# Patient Record
Sex: Female | Born: 2000 | Race: White | Hispanic: No | Marital: Single | Smoking: Never smoker
Health system: Southern US, Community
[De-identification: ages and names within clinical notes are randomized; demographics above are authoritative.]

## PROBLEM LIST (undated history)

## (undated) DIAGNOSIS — J45909 Unspecified asthma, uncomplicated: Secondary | ICD-10-CM

## (undated) DIAGNOSIS — F909 Attention-deficit hyperactivity disorder, unspecified type: Secondary | ICD-10-CM

## (undated) DIAGNOSIS — S060XAA Concussion with loss of consciousness status unknown, initial encounter: Secondary | ICD-10-CM

## (undated) DIAGNOSIS — M545 Low back pain, unspecified: Secondary | ICD-10-CM

## (undated) HISTORY — PX: WISDOM TOOTH EXTRACTION: SHX21

## (undated) HISTORY — DX: Unspecified asthma, uncomplicated: J45.909

## (undated) HISTORY — DX: Low back pain, unspecified: M54.50

---
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
## 2018-05-13 ENCOUNTER — Emergency Department
Admission: EM | Admit: 2018-05-13 | Discharge: 2018-05-13 | Disposition: A | Discharge status: Home / Self Care | Payer: No Typology Code available for payment source | Attending: Emergency Medicine | Admitting: Emergency Medicine

## 2018-05-13 ENCOUNTER — Emergency Department: Payer: No Typology Code available for payment source

## 2018-05-13 DIAGNOSIS — R1031 Right lower quadrant pain: Secondary | ICD-10-CM | POA: Insufficient documentation

## 2018-05-13 LAB — URINALYSIS REFLEX TO MICROSCOPIC EXAM - REFLEX TO CULTURE
Bilirubin, UA: NEGATIVE
Blood, UA: NEGATIVE
Glucose, UA: NEGATIVE
Ketones UA: NEGATIVE
Leukocyte Esterase, UA: NEGATIVE
Nitrite, UA: NEGATIVE
Protein, UR: NEGATIVE
Specific Gravity UA: 1.01 (ref 1.001–1.035)
Urine pH: 6 (ref 5.0–8.0)
Urobilinogen, UA: NORMAL mg/dL (ref 0.2–2.0)

## 2018-05-13 LAB — COMPREHENSIVE METABOLIC PANEL
ALT: 22 U/L (ref 0–55)
AST (SGOT): 16 U/L (ref 5–34)
Albumin/Globulin Ratio: 1.3 (ref 0.9–2.2)
Albumin: 4.1 g/dL (ref 3.5–5.0)
Alkaline Phosphatase: 65 U/L (ref 37–106)
Anion Gap: 11 (ref 5.0–15.0)
BUN: 10 mg/dL (ref 7.0–19.0)
Bilirubin, Total: 0.3 mg/dL (ref 0.2–1.2)
CO2: 22 mEq/L (ref 22–29)
Calcium: 9.6 mg/dL (ref 8.5–10.5)
Chloride: 107 mEq/L (ref 100–111)
Creatinine: 0.8 mg/dL (ref 0.6–1.0)
Globulin: 3.1 g/dL (ref 2.0–3.6)
Glucose: 95 mg/dL (ref 70–100)
Potassium: 3.6 mEq/L (ref 3.5–5.1)
Protein, Total: 7.2 g/dL (ref 6.0–8.3)
Sodium: 140 mEq/L (ref 136–145)

## 2018-05-13 LAB — CBC AND DIFFERENTIAL
Absolute NRBC: 0 10*3/uL (ref 0.00–0.00)
Basophils Absolute Automated: 0.04 10*3/uL (ref 0.00–0.08)
Basophils Automated: 0.4 %
Eosinophils Absolute Automated: 0.58 10*3/uL — ABNORMAL HIGH (ref 0.00–0.44)
Eosinophils Automated: 5.6 %
Hematocrit: 36.5 % (ref 34.7–43.7)
Hgb: 12.8 g/dL (ref 11.4–14.8)
Immature Granulocytes Absolute: 0.03 10*3/uL (ref 0.00–0.07)
Immature Granulocytes: 0.3 %
Lymphocytes Absolute Automated: 3.12 10*3/uL (ref 0.42–3.22)
Lymphocytes Automated: 30.2 %
MCH: 29.5 pg (ref 25.1–33.5)
MCHC: 35.1 g/dL (ref 31.5–35.8)
MCV: 84.1 fL (ref 78.0–96.0)
MPV: 10.6 fL (ref 8.9–12.5)
Monocytes Absolute Automated: 0.5 10*3/uL (ref 0.21–0.85)
Monocytes: 4.8 %
Neutrophils Absolute: 6.05 10*3/uL (ref 1.10–6.33)
Neutrophils: 58.7 %
Nucleated RBC: 0 /100 WBC (ref 0.0–0.0)
Platelets: 334 10*3/uL (ref 142–346)
RBC: 4.34 10*6/uL (ref 3.90–5.10)
RDW: 12 % (ref 11–15)
WBC: 10.32 10*3/uL — ABNORMAL HIGH (ref 3.10–9.50)

## 2018-05-13 LAB — HCG, SERUM, QUALITATIVE: Hcg Qualitative: NEGATIVE

## 2018-05-13 LAB — LIPASE: Lipase: 25 U/L (ref 8–78)

## 2018-05-13 LAB — C-REACTIVE PROTEIN: C-Reactive Protein: <0.1 mg/dL (ref 0.0–0.8)

## 2018-05-13 LAB — GFR: EGFR: >60

## 2018-05-13 MED ORDER — KETOROLAC TROMETHAMINE 15 MG/ML IJ SOLN
15.00 mg | Freq: Once | INTRAMUSCULAR | Status: AC
Start: 2018-05-13 — End: 2018-05-13
  Administered 2018-05-13: 04:00:00 15 mg via INTRAVENOUS
  Filled 2018-05-13: qty 1

## 2018-05-13 MED ORDER — IOHEXOL 350 MG/ML IV SOLN
100.00 mL | Freq: Once | INTRAVENOUS | Status: AC | PRN
Start: 2018-05-13 — End: 2018-05-13
  Administered 2018-05-13: 100 mL via INTRAVENOUS

## 2018-05-13 MED ORDER — ONDANSETRON HCL 4 MG PO TABS
4.00 mg | ORAL_TABLET | Freq: Four times a day (QID) | ORAL | 0 refills | Status: AC | PRN
Start: 2018-05-13 — End: ?

## 2018-05-13 MED ORDER — MORPHINE SULFATE 2 MG/ML IJ/IV SOLN (WRAP)
2.0000 mg | Freq: Once | Status: AC
Start: 2018-05-13 — End: 2018-05-13
  Administered 2018-05-13: 03:00:00 2 mg via INTRAVENOUS
  Filled 2018-05-13: qty 1

## 2018-05-13 MED ORDER — ONDANSETRON HCL 4 MG/2ML IJ SOLN
4.0000 mg | Freq: Once | INTRAMUSCULAR | Status: AC
Start: 2018-05-13 — End: 2018-05-13
  Administered 2018-05-13: 4 mg via INTRAVENOUS
  Filled 2018-05-13: qty 2

## 2018-05-13 MED ORDER — SODIUM CHLORIDE 0.9 % IV BOLUS
1000.0000 mL | Freq: Once | INTRAVENOUS | Status: AC
Start: 2018-05-13 — End: 2018-05-13
  Administered 2018-05-13: 1000 mL via INTRAVENOUS

## 2018-05-13 NOTE — EDIE (Signed)
COLLECTIVE?NOTIFICATION?05/13/2018 02:23?[name], Grace?MRN: [id number]    Criteria Met      Has Advance Directive    Security and Safety  No recent Security Events currently on file    ED Care Guidelines  There are currently no ED Care Guidelines for this patient. Please check your facility's medical records system.        Prescription Monitoring Program  000??- Narcotic Use Score  000??- Sedative Use Score  110??- Stimulant Use Score  000??- Overdose Risk Score  - All Scores range from 000-999 with 75% of the population scoring < 200 and on 1% scoring above 650  - The last digit of the narcotic, sedative, and stimulant score indicates the number of active prescriptions of that type  - Higher Use scores correlate with increased prescribers, pharmacies, mg equiv, and overlapping prescriptions  - Higher Overdose Risk Scores correlate with increased risk of unintentional overdose death   Concerning or unexpectedly high scores should prompt a review of the PMP record; this does not constitute checking PMP for prescribing purposes.      E.D. Visit Count (12 mo.)  Facility Visits    Emergency Room: [facility] 1   Total 1   Note: Visits indicate total known visits.      Recent Emergency Department Visit Summary  Date Facility [facility] Type Diagnoses or Chief Complaint   May 13, 2018 [facility] Emergency Room: [facility]. [facility]      ABD PAIN/VOMITING/DIZZY/NAUSEA          Recent Inpatient Visit Summary  No recorded inpatient visits.     Care Team  There is not a care team on record at this time.   Collective Portal  This patient has registered at the [facility]: [facility] Emergency Department   For more information visit: [email or web address]   Advance Care Plan  Available Documents:     Advance Health Care Directive     PLEASE NOTE:     1.   Any care recommendations and other clinical information are provided as guidelines or for historical purposes  only, and providers should exercise their own clinical judgment when providing care.    2.   You may only use this information for purposes of treatment, payment or health care operations activities, and subject to the limitations of applicable Collective Policies.    3.   You should consult directly with the organization that provided a care guideline or other clinical history with any questions about additional information or accuracy or completeness of information provided.    ? 2020 [place], [employer] - [email or web address]

## 2018-05-13 NOTE — ED Provider Notes (Signed)
[facility]  Emergency Department       Patient Name: Grace [name] Patient DOB:  09-19-2000   Encounter Date:  05/13/2018 Age: 18 y.o. female   Attending ED Physician: Dierdre Searles, D.O. MRN:  [id number]   Room:  09/09 PCP: Blair Promise, MD      Diagnosis / Disposition:   Final Impression  1. Right lower quadrant abdominal pain        Disposition  ED Disposition     ED Disposition Condition Date/Time Comment    Discharge  Tue May 13, 2018  4:31 AM Grace [name] discharge to home/self care.    Condition at disposition: Stable          [place]  [id number]  [address] [address]  [phone]    Call today      Maggie Font, DO  [address]  [place] [address]  [phone]    Call in 1 day      [facility]  [phone] [facility] [address]  [place] [address]  [phone]    As needed, If symptoms worsen      Prescriptions  New Prescriptions    ONDANSETRON (ZOFRAN) 4 MG TABLET    Take 1 tablet (4 mg total) by mouth every 6 (six) hours as needed for Nausea         History of Presenting Illness:   Chief complaint: Abdominal Pain    HPI/ROS is limited by: none  HPI/ROS given by: patient    Location: RLQ  Quality: "punched by someone"  Duration: since 7pm  Severity: moderate          Nursing Notes reviewed and acknowledged.    Grace [name] is a 18 y.o. female G0 P0 with PMH of asthma and ADHD presents with RLQ abdominal pain since about 7pm. She feels like she was "punched by someone." It started gradually, progressively getting worse, constant, worse with movements, better at rest, radiates to the back. Associated with nausea and vomiting x 1. She's never had pain like this before. Her LMP was 05/11/18. She denies diarrhea, fevers, chills, problems urinating, dizziness, numbness, weakness, chest pain, or sob. She denies recent travel, cough, congestion, sore throat, injury, bad food, sick contacts, or recent antibiotics. She denies sexual activity,  endometriosis, or ovarian cysts.    In addition to the above history, please see nursing notes. Allergies, meds, past medical, family, social hx, and the results of the diagnostic studies performed have been reviewed by myself.      Review of Systems   General: No fevers/chills, No sweating  ENT:   No congestion. No sore throat.  No difficulty swallowing.  Respiratory: No cough.  No shortness of breath.  Cardiovascular: No chest pain.  No palpitations.  GI: + Nausea, +Vomiting, + abdominal pain  GU:  No dysuria. No hematuria.   Neurological:  No headache.  No weakness. No numbness  Musculoskeletal:  No neck or extremity pain, +back pain  Skin:  No rash.  No skin lesions.  Psychiatric:  No depression.  No anxiety.      All other systems reviewed and negative except as above, pertinent findings in HPI.          Allergies / Medications:   Pt has No Known Allergies.    Current/Home Medications    ALBUTEROL (PROVENTIL HFA;VENTOLIN HFA) 108 (90 BASE) MCG/ACT INHALER    Inhale 2 puffs into the lungs.  AMPHETAMINE-DEXTROAMPHETAMINE (ADDERALL) 15 MG TABLET    Take 15 mg by mouth daily    BUDESONIDE-FORMOTEROL FUMARATE (SYMBICORT IN)    Inhale into the lungs.    FLUTICASONE-SALMETEROL (ADVAIR HFA) 115-21 MCG/ACT INHALER    Inhale 2 puffs into the lungs 2 (two) times daily.    IBUPROFEN (ADVIL,MOTRIN) 200 MG TABLET    Take 200 mg by mouth every 6 (six) hours as needed for Pain.         Past History:   Medical: Pt has a past medical history of Asthma, Concussion, and Lower back pain.    Surgical: Pt  has no past surgical history on file.    Family: The family history includes No known problems in her father and mother.    Social: Pt reports that she has never smoked. She has never used smokeless tobacco. She reports current alcohol use. She reports current drug use.      Physical Exam:   Constitutional: Vital signs reviewed. +mild distress secondary to pain  Head: Normocephalic, atraumatic  Eyes: Conjunctiva and sclera are  normal.  No injection or discharge.  Ears, Nose, Throat:  Normal external examination of the nose and ears.    Neck: Supple. No JVD.  Respiratory/Chest: No respiratory distress. Breath sounds clear to auscultation  Cardiac: regular rate and rhythm  Abdomen:  Soft, +periumbilical/RLQ tenderness, no guarding/rebound/rigidity, No Murphy's sign, +McBurney's point tenderness  Back:  +Right CVAT  Extremities:  no edema, positive pulses with good capillary refill, good ROM  Neurological: No sensory or motor deficits. Speech normal.  Skin: Warm and dry. No rash.  Psychiatric: Alert and conversant.  Normal affect.          MDM:   DDx includes but not limited to: appendicitis, ectopic pregnancy, colitis, ovarian cyst/torsion, TOA, cholecystitis, enteritis, UTI, pyelonephritis, mesenteric adenitis      Course in ED:   Patient is given IVF, Zorfan, Morphine with some relief. Toradol was given that controlled her pain.    Wbc 10, hcg negative, CRP less than 1, cmp and lipase within normal limits, UA clear.    CT A/P:  IMPRESSION:        1.  Partially obscured distal appendix without secondary evidence of  acute appendicitis. No other acute abnormality identified in the abdomen  and pelvis      Patient to take zofran, tylenol, or  Motrin prn. Patient to follow up with pcp and surgery in 1-2 days. Appendicitis signs and symptoms discussed with the patient. Patient agrees with the plan.      Diagnostic Results:   The results of the diagnostic studies have been reviewed by myself:    Radiologic Studies  Ct Abd/pelvis With Iv Contrast Only    Result Date: 05/13/2018   1.  Partially obscured distal appendix without secondary evidence of acute appendicitis. No other acute abnormality identified in the abdomen and pelvis Demetrios Isaacs, MD 05/13/2018 4:13 AM      Lab Studies  Labs Reviewed   CBC AND DIFFERENTIAL - Abnormal; Notable for the following components:       Result Value    WBC 10.32 (*)     Abs Eos Automated 0.58 (*)     All other  components within normal limits    Narrative:     Replace urinary catheter prior to obtaining the urine culture  if it has been in place for greater than or equal to 14  days:->N/A No Foley  Indications for U/A Reflex  to Micro - Reflex to  Culture:->Suprapubic Pain/Tenderness or Dysuria   COMPREHENSIVE METABOLIC PANEL    Narrative:     Replace urinary catheter prior to obtaining the urine culture  if it has been in place for greater than or equal to 14  days:->N/A No Foley  Indications for U/A Reflex to Micro - Reflex to  Culture:->Suprapubic Pain/Tenderness or Dysuria   LIPASE    Narrative:     Replace urinary catheter prior to obtaining the urine culture  if it has been in place for greater than or equal to 14  days:->N/A No Foley  Indications for U/A Reflex to Micro - Reflex to  Culture:->Suprapubic Pain/Tenderness or Dysuria   URINALYSIS REFLEX TO MICROSCOPIC EXAM - REFLEX TO CULTURE    Narrative:     Replace urinary catheter prior to obtaining the urine culture  if it has been in place for greater than or equal to 14  days:->N/A No Foley  Indications for U/A Reflex to Micro - Reflex to  Culture:->Suprapubic Pain/Tenderness or Dysuria   HCG, SERUM, QUALITATIVE    Narrative:     Replace urinary catheter prior to obtaining the urine culture  if it has been in place for greater than or equal to 14  days:->N/A No Foley  Indications for U/A Reflex to Micro - Reflex to  Culture:->Suprapubic Pain/Tenderness or Dysuria   GFR    Narrative:     Replace urinary catheter prior to obtaining the urine culture  if it has been in place for greater than or equal to 14  days:->N/A No Foley  Indications for U/A Reflex to Micro - Reflex to  Culture:->Suprapubic Pain/Tenderness or Dysuria   C-REACTIVE PROTEIN             EKG/ Procedure / Critical Care time:   EKG: none       ATTESTATIONS   This chart was generated by an EMR and may contain errors or additions/omissions not intended by the user.       Dierdre Searles, D.O.

## 2018-05-13 NOTE — Discharge Instructions (Signed)
RETURN TO THE ED IF YOUR SYMPTOMS WORSEN OR IF YOU HAVE ANY CONCERNS, PERSISTENT VOMITING, UNCONTROLLED FEVERS, UNABLE TO DRINK FLUIDS, DIARRHEA, PROBLEMS URINATING, FEEL FAINT, CHEST PAIN, OR DIFFICULTY BREATHING.    CONTINUE TYLENOL OR MOTRIN AS NEEDED AT HOME FOR PAIN.        Right Lower Abdominal Pain    You have been seen for right lower abdominal (belly) pain.    Abdominal pain is one of the most common reasons why patients visit the Emergency Department. There are many causes of abdominal pain. Some causes are dangerous. These include appendicitis, bleeding ulcers, pancreatitis, or bowel obstruction. Some are painful but not dangerous, like irritable bowel syndrome, stomach viruses, constipation and others.    In your case, the pain seems to be located in the right lower area of your abdomen. This is often called the right lower quadrant. The appendix is located in this area.    Based on your evaluation today, your doctor doesn't think your symptoms are due to a life-threatening or dangerous problem. Sometimes the cause of abdominal pain can't be found during the emergency department visit. In most cases, the pain gets better on its own. Sometimes, however, the pain may be constant and a specialist has to investigate it further.    Though we dont believe your condition is dangerous right now, it is important to be careful. Sometimes a problem that seems mild can become serious later. This is why it is very important that you return here or go to the nearest Emergency Department if you don't get better or your symptoms get worse.    You should come back here to get re-checked in 12 hours.    Depending on how bad your abdominal pain and other symptoms are, your doctor may prescribe medicines for you. These may include pain medicine, nausea medicine, or something to help stop spasms in the belly. It is veryimportant to take these medicines only as prescribed. This is because they can be dangerous if  not taken properly. If you are given a prescription pain medication, it is very important to not drive or operate heavy machinery while using this medicine.    You may be asked to follow up with your primary care doctor for more testing. In some cases, you may need to be seen by a gastroenterologist (stomach doctor) or another specialist to help find the cause of the pain and the right treatment. It is very important to follow up as recommended by your doctor today. This way, you are sure to get the best care.    Some things you can try to help with symptoms are:     Take your prescribed medications.   Starta diet of clear liquids and slowly increase the types of food you eat (liquids first, then bland solid foods), as you feel comfortable.   Get plenty of rest.   Drink lots of liquids.   Avoid any foods or drinks that make the pain worse.    YOU SHOULD SEEK MEDICAL ATTENTION IMMEDIATELY, EITHER HERE OR AT THE NEAREST EMERGENCY DEPARTMENT, IF ANY OF THE FOLLOWING OCCUR:     You have new chest pain, lightheadedness, shortness of breath, or dizziness.   You get a fever (temperature higher than 100.65F or 38C).   Your pain gets worse or doesn't get better as expected.   If you have vomiting and can't keep fluids down.   You start throwing up blood.   You see blood in your stool (  poop). This means it will be bright red or black and tarry.   You have any other symptoms, concerns, or you are not getting better as expected.   You get worse or feel you can't wait until your follow-up appointment to get treated.    If you can't follow up with your doctor, or if at any time you feel you need to be rechecked or seen again, come back here or go to the nearest emergency department.

## 2018-08-30 IMAGING — CR DG CHEST 2V
1 series · 2 of 2 positions shown · non-contrast
Comparison: None.

CLINICAL DATA: 16-year-old female with cough.

EXAM:
CHEST  2 VIEW

[Series 1: dg chest 2 view · 0.14mm/px · 2 of 2 slices shown]
[im 1/2]
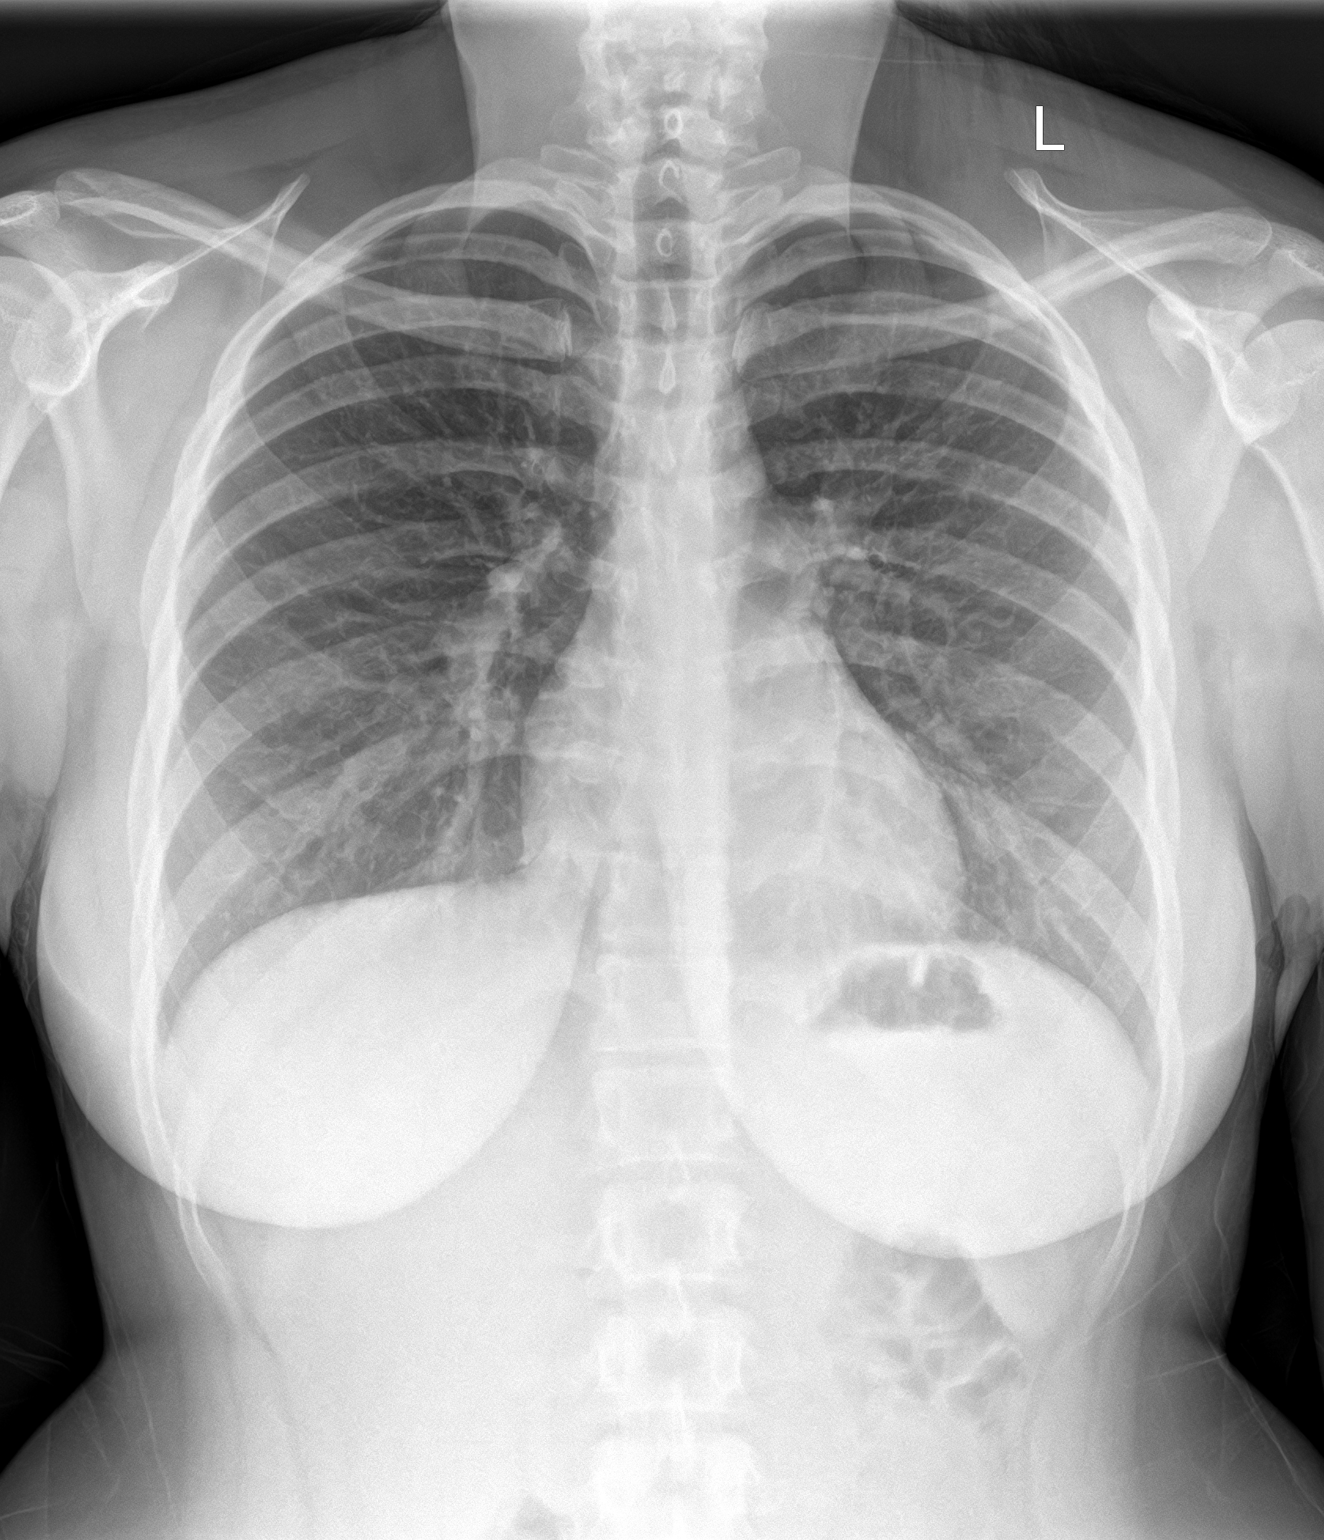
[im 2/2]
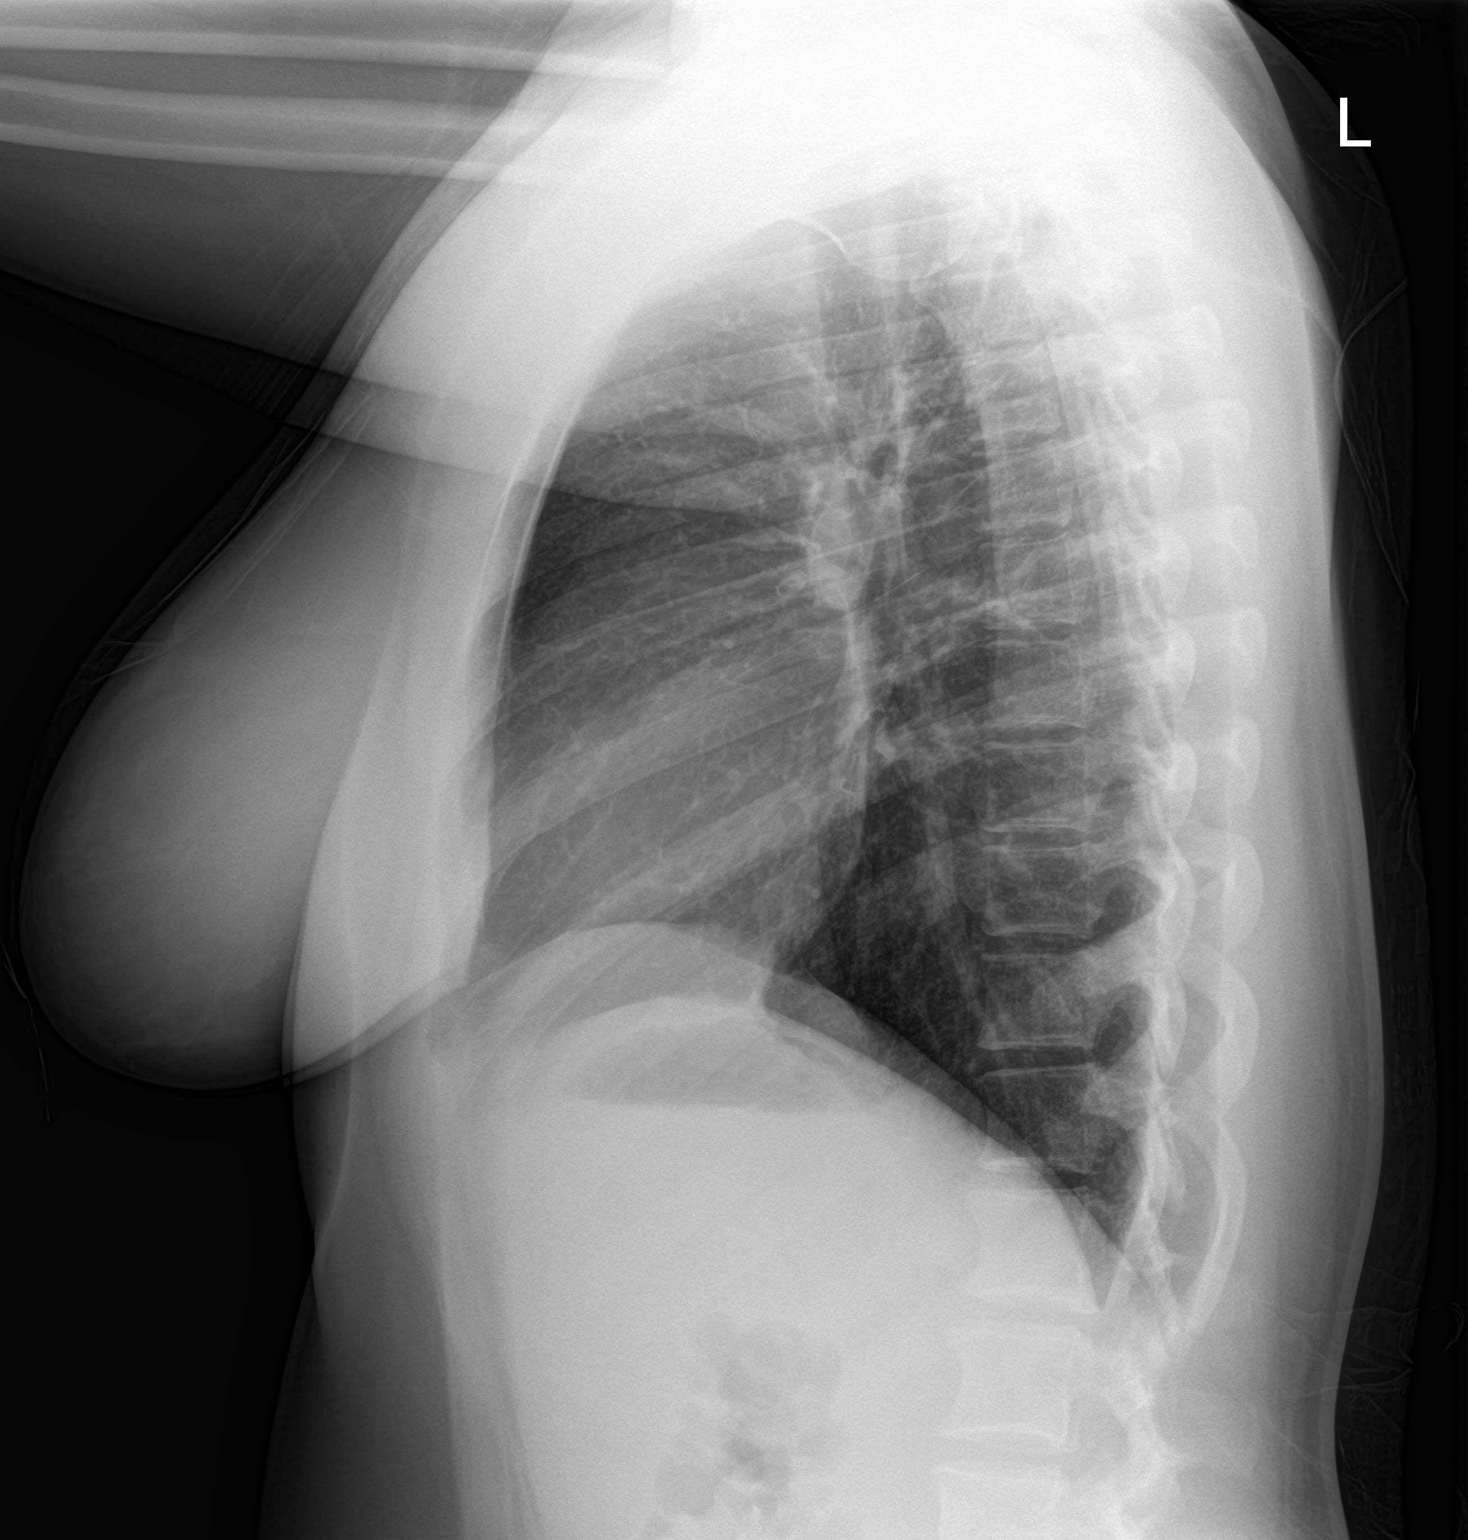

[2 of 2 positions shown; findings below may reference images not displayed]

FINDINGS: The heart size and mediastinal contours are within normal limits.
Both lungs are clear. The visualized skeletal structures are
unremarkable.
IMPRESSION: No active cardiopulmonary disease.

## 2019-01-01 ENCOUNTER — Other Ambulatory Visit: Payer: Self-pay | Admitting: Otolaryngology

## 2019-01-01 DIAGNOSIS — R590 Localized enlarged lymph nodes: Secondary | ICD-10-CM

## 2019-01-05 ENCOUNTER — Ambulatory Visit: Payer: No Typology Code available for payment source

## 2019-04-20 ENCOUNTER — Ambulatory Visit: Payer: 59 | Attending: Internal Medicine

## 2019-04-20 DIAGNOSIS — Z23 Encounter for immunization: Secondary | ICD-10-CM

## 2019-04-20 NOTE — Progress Notes (Signed)
   Covid-19 Vaccination Clinic  Name:  Maybelline Kolarik    MRN: [id number] DOB: [birth year]  04/20/2019  Ms. Blass was observed post Covid-19 immunization for 15 minutes without incident. She was provided with Vaccine Information Sheet and instruction to access the V-Safe system.   Ms. Mccalip was instructed to call 911 with any severe reactions post vaccine: Marland Kitchen Difficulty breathing  . Swelling of face and throat  . A fast heartbeat  . A bad rash all over body  . Dizziness and weakness   Immunizations Administered    Name Date Dose VIS Date Route   Pfizer COVID-19 Vaccine 04/20/2019 12:43 PM 0.3 mL 01/09/2019 Intramuscular   Manufacturer: [employer], [employer]   Lot: [id number]   NDC: [address]-6

## 2019-04-21 ENCOUNTER — Telehealth: Payer: Self-pay

## 2019-04-21 NOTE — Telephone Encounter (Signed)
Pt called to cancel her 2nd dose vaccine (pfizer). She experienced an asthma attack 2 hours after getting the vaccine and was seen at her school clinic. She visited her PCP today who instructed her to submit her adverse effects to the [facility] online and cancel her app. Her app was canceled.   Sophia Webster

## 2019-04-22 ENCOUNTER — Encounter: Payer: Self-pay | Admitting: Emergency Medicine

## 2019-04-22 ENCOUNTER — Emergency Department: Payer: 59

## 2019-04-22 ENCOUNTER — Emergency Department
Admission: EM | Admit: 2019-04-22 | Discharge: 2019-04-22 | Disposition: A | Discharge status: Home / Self Care | Payer: 59 | Attending: Student in an Organized Health Care Education/Training Program | Admitting: Student in an Organized Health Care Education/Training Program

## 2019-04-22 ENCOUNTER — Other Ambulatory Visit: Payer: Self-pay

## 2019-04-22 DIAGNOSIS — J45909 Unspecified asthma, uncomplicated: Secondary | ICD-10-CM | POA: Insufficient documentation

## 2019-04-22 DIAGNOSIS — Z79899 Other long term (current) drug therapy: Secondary | ICD-10-CM | POA: Insufficient documentation

## 2019-04-22 DIAGNOSIS — F909 Attention-deficit hyperactivity disorder, unspecified type: Secondary | ICD-10-CM | POA: Diagnosis not present

## 2019-04-22 DIAGNOSIS — R0602 Shortness of breath: Secondary | ICD-10-CM | POA: Insufficient documentation

## 2019-04-22 DIAGNOSIS — F17210 Nicotine dependence, cigarettes, uncomplicated: Secondary | ICD-10-CM | POA: Insufficient documentation

## 2019-04-22 HISTORY — DX: Attention-deficit hyperactivity disorder, unspecified type: F90.9

## 2019-04-22 LAB — CBC WITH DIFFERENTIAL/PLATELET
Abs Immature Granulocytes: 0.02 10*3/uL (ref 0.00–0.07)
Basophils Absolute: 0.1 10*3/uL (ref 0.0–0.1)
Basophils Relative: 1 %
Eosinophils Absolute: 0.3 10*3/uL (ref 0.0–0.5)
Eosinophils Relative: 4 %
HCT: 36.8 % (ref 36.0–46.0)
Hemoglobin: 12.4 g/dL (ref 12.0–15.0)
Immature Granulocytes: 0 %
Lymphocytes Relative: 24 %
Lymphs Abs: 1.7 10*3/uL (ref 0.7–4.0)
MCH: 29.4 pg (ref 26.0–34.0)
MCHC: 33.7 g/dL (ref 30.0–36.0)
MCV: 87.2 fL (ref 80.0–100.0)
Monocytes Absolute: 0.7 10*3/uL (ref 0.1–1.0)
Monocytes Relative: 10 %
Neutro Abs: 4.3 10*3/uL (ref 1.7–7.7)
Neutrophils Relative %: 61 %
Platelets: 290 10*3/uL (ref 150–400)
RBC: 4.22 MIL/uL (ref 3.87–5.11)
RDW: 12.3 % (ref 11.5–15.5)
WBC: 7.1 10*3/uL (ref 4.0–10.5)
nRBC: 0 % (ref 0.0–0.2)

## 2019-04-22 LAB — COMPREHENSIVE METABOLIC PANEL
ALT: 20 U/L (ref 0–44)
AST: 25 U/L (ref 15–41)
Albumin: 4.1 g/dL (ref 3.5–5.0)
Alkaline Phosphatase: 54 U/L (ref 38–126)
Anion gap: 8 (ref 5–15)
BUN: 11 mg/dL (ref 6–20)
CO2: 24 mmol/L (ref 22–32)
Calcium: 9.2 mg/dL (ref 8.9–10.3)
Chloride: 106 mmol/L (ref 98–111)
Creatinine, Ser: 0.76 mg/dL (ref 0.44–1.00)
GFR calc Af Amer: >60 mL/min (ref >60)
GFR calc non Af Amer: >60 mL/min (ref >60)
Glucose, Bld: 94 mg/dL (ref 70–99)
Potassium: 3.8 mmol/L (ref 3.5–5.1)
Sodium: 138 mmol/L (ref 135–145)
Total Bilirubin: 0.6 mg/dL (ref 0.3–1.2)
Total Protein: 7.4 g/dL (ref 6.5–8.1)

## 2019-04-22 LAB — TROPONIN I (HIGH SENSITIVITY): Troponin I (High Sensitivity): <2 ng/L (ref <18)

## 2019-04-22 MED ORDER — IOHEXOL 350 MG/ML SOLN
75.0000 mL | Freq: Once | INTRAVENOUS | Status: AC | PRN
  Administered 2019-04-22: 21:00:00 75 mL via INTRAVENOUS
  Filled 2019-04-22: qty 75

## 2019-04-22 MED ORDER — LORAZEPAM 0.5 MG PO TABS
0.5000 mg | ORAL_TABLET | Freq: Once | ORAL | Status: AC
  Administered 2019-04-22: 0.5 mg via ORAL
  Filled 2019-04-22: qty 1

## 2019-04-22 NOTE — ED Notes (Signed)
Pt to the er for asthma and anxiety. Pt says she got her first covid shot the other day and she has had increased pain with breathing and SOB. SOB is intermittent on visualization and resp rate increases. Pt having no difficulty in completing sentences, Sats are 100% on room air. Pt denies any reactions to injections prior.

## 2019-04-22 NOTE — ED Notes (Signed)
This RN spoke with lab regarding add-on troponin.

## 2019-04-22 NOTE — ED Triage Notes (Signed)
Pt comes into the ED via EMS from [facility] today with a hx of asthma, pt was hyperventilating , 95% on RA, EMS placed pt on 3L [place]. 4mg  zofran , #20G LAC, 130/60, ST 115.the patient states she had her 1st covid vaccine on Monday and had an episode then. CBG 123

## 2019-04-22 NOTE — ED Notes (Signed)
Pt assisted to the bathroom. Able to stand without assistance.

## 2019-04-22 NOTE — ED Triage Notes (Signed)
Pt in via [facility]; reports having 2 asthma attacks since receiving initial Covid vaccine a couple of days ago.  Reports using nebulizers and then having sudden onset chest tightness while walking to [facility].  Pt hyperventilating in triage; advised to slow breathing down.  Vitals WDL.

## 2019-04-22 NOTE — Discharge Instructions (Signed)
CT of your chest shows mild heart enlargement and mild enlargement of the ventricles, which is the bottom of the heart. This is more than likely an incidental finding.  I would still like for you to follow-up with cardiology to ensure that you do not become symptomatic in the future.

## 2019-04-22 NOTE — ED Provider Notes (Signed)
Emergency Department Provider Note  ____________________________________________  Time seen: Approximately 11:24 PM  I have reviewed the triage vital signs and the nursing notes.   HISTORY  Chief Complaint Asthma and Panic Attack   Historian Patient    HPI Sophia Webster is a 19 y.o. female presenting to the emergency department with shortness of breath.  Patient states that she has had shortness of breath for the past 2 days since receiving her COVID-19 vaccination.  She also has some right-sided chest discomfort with deep inspiration.  She states that shortness of breath is worse with prolonged speech.  She denies rhinorrhea, nasal congestion or nonproductive cough.  No rash.  No emesis or diarrhea.  Patient states that she has been seen by student health and was given a nebulizer given her history of asthma and was started on a steroid.  Patient states that she has not started steroid yet but has been using nebulized albuterol at home and reports that her shortness of breath is not any better.  She denies a history of generalized anxiety.   Past Medical History:  Diagnosis Date  . ADHD (attention deficit hyperactivity disorder)   . Asthma      Immunizations up to date:  Yes.     Past Medical History:  Diagnosis Date  . ADHD (attention deficit hyperactivity disorder)   . Asthma     There are no problems to display for this patient.   Past Surgical History:  Procedure Laterality Date  . WISDOM TOOTH EXTRACTION      Prior to Admission medications   Medication Sig Start Date End Date Taking? Authorizing Provider  aluminum-magnesium hydroxide-simethicone (MAALOX) 200-200-20 MG/5ML SUSP Take 30 mLs by mouth 4 (four) times daily -  before meals and at bedtime. 10/08/17   Sharman Cheek, MD  dicyclomine (BENTYL) 20 MG tablet Take 1 tablet (20 mg total) by mouth 3 (three) times daily as needed for spasms. 10/08/17   Sharman Cheek, MD  famotidine (PEPCID) 20 MG tablet  Take 1 tablet (20 mg total) by mouth 2 (two) times daily. 10/08/17   Sharman Cheek, MD  lidocaine (XYLOCAINE) 2 % solution Use as directed 20 mLs every 2 (two) hours as needed in the mouth or throat for mouth pain. Gargle and spit out 12/13/16   Sharman Cheek, MD  naproxen (NAPROSYN) 500 MG tablet Take 1 tablet (500 mg total) by mouth 2 (two) times daily with a meal. 10/08/17   Sharman Cheek, MD  ondansetron (ZOFRAN ODT) 4 MG disintegrating tablet Take 1 tablet (4 mg total) by mouth every 8 (eight) hours as needed for nausea or vomiting. 10/08/17   Sharman Cheek, MD  predniSONE (DELTASONE) 20 MG tablet Take 2 tablets (40 mg total) daily by mouth. 12/13/16   Sharman Cheek, MD    Allergies Patient has no known allergies.  No family history on file.  Social History Social History   Tobacco Use  . Smoking status: Current Some Day Smoker    Types: Cigarettes  . Smokeless tobacco: Never Used  Substance Use Topics  . Alcohol use: Yes  . Drug use: Yes    Types: Marijuana     Review of Systems  Constitutional: No fever/chills Eyes:  No discharge ENT: No upper respiratory complaints. Respiratory: Patient has shortness of breath.  Gastrointestinal:   No nausea, no vomiting.  No diarrhea.  No constipation. Musculoskeletal: Negative for musculoskeletal pain. Skin: Negative for rash, abrasions, lacerations, ecchymosis.    ____________________________________________   PHYSICAL EXAM:  VITAL SIGNS: ED Triage Vitals  Enc Vitals Group     BP 04/22/19 1855 121/75     Pulse Rate 04/22/19 1855 94     Resp 04/22/19 1855 (!) 26     Temp 04/22/19 1855 98.5 F (36.9 C)     Temp src --      SpO2 04/22/19 1855 100 %     Weight 04/22/19 1856 145 lb (65.8 kg)     Height 04/22/19 1856 5\' 4"  (1.626 m)     Head Circumference --      Peak Flow --      Pain Score 04/22/19 1855 7     Pain Loc --      Pain Edu? --      Excl. in [place]? --      Constitutional: Alert and  oriented. Well appearing and in no acute distress. Eyes: Conjunctivae are normal. PERRL. EOMI. Head: Atraumatic. Cardiovascular: Normal rate, regular rhythm. Normal S1 and S2.  Good peripheral circulation. Respiratory: Patient's inspirations are shallow and she is tachypneic.  She does have good air entry to the lung bases and there is no wheezing auscultated throughout lung fields. Gastrointestinal: Bowel sounds x 4 quadrants. Soft and nontender to palpation. No guarding or rigidity. No distention. Musculoskeletal: Full range of motion to all extremities. No obvious deformities noted Neurologic:  Normal for age. No gross focal neurologic deficits are appreciated.  Skin:  Skin is warm, dry and intact. No rash noted. Psychiatric: Mood and affect are normal for age. Speech and behavior are normal.   ____________________________________________   LABS (all labs ordered are listed, but only abnormal results are displayed)  Labs Reviewed  CBC WITH DIFFERENTIAL/PLATELET  COMPREHENSIVE METABOLIC PANEL  TROPONIN I (HIGH SENSITIVITY)  TROPONIN I (HIGH SENSITIVITY)   ____________________________________________  EKG   ____________________________________________  RADIOLOGY Unk Pinto, personally viewed and evaluated these images (plain radiographs) as part of my medical decision making, as well as reviewing the written report by the radiologist.  CT Angio Chest PE W and/or Wo Contrast  Result Date: 04/22/2019 CLINICAL DATA:  Short of breath, hyperventilation, asthma EXAM: CT ANGIOGRAPHY CHEST WITH CONTRAST TECHNIQUE: Multidetector CT imaging of the chest was performed using the standard protocol during bolus administration of intravenous contrast. Multiplanar CT image reconstructions and MIPs were obtained to evaluate the vascular anatomy. CONTRAST:  68mL OMNIPAQUE IOHEXOL 350 MG/ML SOLN COMPARISON:  None. FINDINGS: Cardiovascular: This is a technically adequate evaluation of the  pulmonary vasculature. No filling defects or pulmonary emboli. Heart is mildly enlarged with biventricular dilatation. No pericardial effusion. Thoracic aorta demonstrates normal caliber without dissection. Mediastinum/Nodes: No enlarged mediastinal, hilar, or axillary lymph nodes. Thyroid gland, trachea, and esophagus demonstrate no significant findings. Soft tissue density within the anterior mediastinum likely residual thymus in a patient of this age. Lungs/Pleura: No airspace disease, effusion, or pneumothorax. Central airways are patent. Upper Abdomen: No acute abnormality. Musculoskeletal: No acute or destructive bony lesions. Reconstructed images demonstrate no additional findings. Review of the MIP images confirms the above findings. IMPRESSION: 1. No evidence of pulmonary embolus. 2. No acute intrathoracic process. 3. Mild cardiomegaly with bilateral ventricular dilatation. Electronically Signed   By: Randa Ngo M.D.   On: 04/22/2019 21:40    ____________________________________________    PROCEDURES  Procedure(s) performed:     Procedures     Medications  LORazepam (ATIVAN) tablet 0.5 mg (0.5 mg Oral Given 04/22/19 2042)  iohexol (OMNIPAQUE) 350 MG/ML injection 75 mL (75 mLs Intravenous Contrast  Given 04/22/19 2127)     ____________________________________________   INITIAL IMPRESSION / ASSESSMENT AND PLAN / ED COURSE  Pertinent labs & imaging results that were available during my care of the patient were reviewed by me and considered in my medical decision making (see chart for details).  Clinical Course as of Apr 22 2322  Wed Apr 22, 2019  2116 Basophils Absolute: 0.1 [JW]    Clinical Course User Index [JW] Orvil Feil, PA-C     Assessment and plan Shortness of breath.  19 year old female presents to the emergency department with shortness of breath for the past 2 days.  Patient was tachypneic at triage but satting at 100% on room air.  To auscultation,  lung fields were clear without adventitious lung sounds.  Differential diagnosis included anxiety, arrhythmia, cardiomyopathy, PE, pneumonia...  EKG revealed normal sinus rhythm without ST segment elevation or other apparent arrhythmia.  Troponin was within reference range.  CTA did indicate that patient had mild cardiac enlargement and mild dilation of the ventricles.  There was no evidence of PE on CTA.  Patient reported that her shortness of breath completely resolved after Ativan was administered and that she felt much better and ready to go home.  I conveyed findings of cardiac enlargement and ventricular dilation to patient and recommended outpatient follow-up with cardiology.  She voiced understanding regarding this recommendation.   ____________________________________________  FINAL CLINICAL IMPRESSION(S) / ED DIAGNOSES  Final diagnoses:  Shortness of breath      NEW MEDICATIONS STARTED DURING THIS VISIT:  ED Discharge Orders    None          This chart was dictated using voice recognition software/Dragon. Despite best efforts to proofread, errors can occur which can change the meaning. Any change was purely unintentional.     Gasper Lloyd 04/22/19 2329    Willy Eddy, MD 04/23/19 [phone]

## 2019-05-11 ENCOUNTER — Ambulatory Visit: Payer: 59

## 2019-10-27 ENCOUNTER — Ambulatory Visit: Payer: Self-pay

## 2020-05-05 ENCOUNTER — Other Ambulatory Visit: Payer: Self-pay

## 2020-05-05 ENCOUNTER — Inpatient Hospital Stay
Admission: EM | Admit: 2020-05-05 | Discharge: 2020-05-08 | DRG: 202 | Disposition: A | Discharge status: Home / Self Care | Payer: 59 | Attending: Internal Medicine | Admitting: Internal Medicine

## 2020-05-05 ENCOUNTER — Emergency Department: Payer: 59

## 2020-05-05 DIAGNOSIS — R0602 Shortness of breath: Secondary | ICD-10-CM

## 2020-05-05 DIAGNOSIS — J4541 Moderate persistent asthma with (acute) exacerbation: Principal | ICD-10-CM | POA: Diagnosis present

## 2020-05-05 DIAGNOSIS — F1290 Cannabis use, unspecified, uncomplicated: Secondary | ICD-10-CM | POA: Diagnosis present

## 2020-05-05 DIAGNOSIS — J9601 Acute respiratory failure with hypoxia: Secondary | ICD-10-CM | POA: Diagnosis present

## 2020-05-05 DIAGNOSIS — F909 Attention-deficit hyperactivity disorder, unspecified type: Secondary | ICD-10-CM | POA: Diagnosis present

## 2020-05-05 DIAGNOSIS — Z7952 Long term (current) use of systemic steroids: Secondary | ICD-10-CM

## 2020-05-05 DIAGNOSIS — A419 Sepsis, unspecified organism: Secondary | ICD-10-CM

## 2020-05-05 DIAGNOSIS — Z79899 Other long term (current) drug therapy: Secondary | ICD-10-CM

## 2020-05-05 DIAGNOSIS — R0603 Acute respiratory distress: Secondary | ICD-10-CM

## 2020-05-05 DIAGNOSIS — J4551 Severe persistent asthma with (acute) exacerbation: Secondary | ICD-10-CM

## 2020-05-05 DIAGNOSIS — Z20822 Contact with and (suspected) exposure to covid-19: Secondary | ICD-10-CM | POA: Diagnosis present

## 2020-05-05 DIAGNOSIS — F17210 Nicotine dependence, cigarettes, uncomplicated: Secondary | ICD-10-CM | POA: Diagnosis present

## 2020-05-05 LAB — CBC WITH DIFFERENTIAL/PLATELET
Abs Immature Granulocytes: 0.01 10*3/uL (ref 0.00–0.07)
Basophils Absolute: 0 10*3/uL (ref 0.0–0.1)
Basophils Relative: 0 %
Eosinophils Absolute: 0.1 10*3/uL (ref 0.0–0.5)
Eosinophils Relative: 1 %
HCT: 38.2 % (ref 36.0–46.0)
Hemoglobin: 13 g/dL (ref 12.0–15.0)
Immature Granulocytes: 0 %
Lymphocytes Relative: 13 %
Lymphs Abs: 1 10*3/uL (ref 0.7–4.0)
MCH: 30 pg (ref 26.0–34.0)
MCHC: 34 g/dL (ref 30.0–36.0)
MCV: 88 fL (ref 80.0–100.0)
Monocytes Absolute: 0.6 10*3/uL (ref 0.1–1.0)
Monocytes Relative: 8 %
Neutro Abs: 6 10*3/uL (ref 1.7–7.7)
Neutrophils Relative %: 78 %
Platelets: 276 10*3/uL (ref 150–400)
RBC: 4.34 MIL/uL (ref 3.87–5.11)
RDW: 12.5 % (ref 11.5–15.5)
WBC: 7.7 10*3/uL (ref 4.0–10.5)
nRBC: 0 % (ref 0.0–0.2)

## 2020-05-05 LAB — BASIC METABOLIC PANEL
Anion gap: 9 (ref 5–15)
BUN: 7 mg/dL (ref 6–20)
CO2: 23 mmol/L (ref 22–32)
Calcium: 9.1 mg/dL (ref 8.9–10.3)
Chloride: 104 mmol/L (ref 98–111)
Creatinine, Ser: 0.83 mg/dL (ref 0.44–1.00)
GFR, Estimated: >60 mL/min (ref >60)
Glucose, Bld: 97 mg/dL (ref 70–99)
Potassium: 3.7 mmol/L (ref 3.5–5.1)
Sodium: 136 mmol/L (ref 135–145)

## 2020-05-05 LAB — TROPONIN I (HIGH SENSITIVITY): Troponin I (High Sensitivity): 3 ng/L (ref <18)

## 2020-05-05 MED ORDER — IPRATROPIUM-ALBUTEROL 0.5-2.5 (3) MG/3ML IN SOLN
3.0000 mL | Freq: Once | RESPIRATORY_TRACT | Status: AC
  Administered 2020-05-05: 3 mL via RESPIRATORY_TRACT
  Filled 2020-05-05: qty 3

## 2020-05-05 MED ORDER — SODIUM CHLORIDE 0.9 % IV BOLUS
1000.0000 mL | Freq: Once | INTRAVENOUS | Status: AC
  Administered 2020-05-05: 1000 mL via INTRAVENOUS

## 2020-05-05 MED ORDER — ONDANSETRON HCL 4 MG/2ML IJ SOLN
INTRAMUSCULAR | Status: AC
  Administered 2020-05-05: 4 mg via INTRAVENOUS
  Filled 2020-05-05: qty 2

## 2020-05-05 MED ORDER — ONDANSETRON HCL 4 MG/2ML IJ SOLN: 4.0000 mg | Freq: Once | INTRAMUSCULAR | Status: AC

## 2020-05-05 MED ORDER — METHYLPREDNISOLONE SODIUM SUCC 125 MG IJ SOLR
125.0000 mg | Freq: Once | INTRAMUSCULAR | Status: AC
  Administered 2020-05-05: 125 mg via INTRAVENOUS
  Filled 2020-05-05: qty 2

## 2020-05-05 MED ORDER — MAGNESIUM SULFATE 2 GM/50ML IV SOLN
2.0000 g | Freq: Once | INTRAVENOUS | Status: AC
  Administered 2020-05-05: 2 g via INTRAVENOUS
  Filled 2020-05-05: qty 50

## 2020-05-05 NOTE — ED Notes (Signed)
Patient transported to X-ray with technician in stretcher.

## 2020-05-05 NOTE — ED Notes (Signed)
RT at bedside to place pt on bipap

## 2020-05-05 NOTE — ED Notes (Signed)
RT called to place pt on bipap per Dolores Frame MD

## 2020-05-05 NOTE — ED Notes (Signed)
Pt placed on bipap at this time by RT

## 2020-05-05 NOTE — ED Triage Notes (Signed)
Pt states for the past few days she has had a cold, pt has hx of asthma. Pt states she has also had cough and fever. Pt states her fever at home today was 102.4. Pt states she has been using her nebs and taking cold medicine at home with no relief.

## 2020-05-05 NOTE — ED Provider Notes (Addendum)
[facility] Emergency Department Provider Note   ____________________________________________   Event Date/Time   First MD Initiated Contact with Patient 05/05/20 2307     (approximate)  I have reviewed the triage vital signs and the nursing notes.   HISTORY  Chief Complaint Shortness of Breath  Level of V caveat: Limited by respiratory distress  HPI Sophia Webster is a 20 y.o. female who presents to the ED from home with a chief complaint of breathing difficulty.  Patient has a history of severe asthma, on Xolair.  Reports a 2 to 3-day history of cold-like symptoms including fever, nonproductive cough, chest pain on coughing.  Reports she has been using her nebulizers and taking OTC cold medicine without relief of symptoms.  History of hospitalizations for asthma, unclear if patient has been intubated previously.  Denies abdominal pain, nausea, vomiting or diarrhea.  Patient is vaccinated against COVID-19.     Past Medical History:  Diagnosis Date  . ADHD (attention deficit hyperactivity disorder)   . Asthma     Patient Active Problem List   Diagnosis Date Noted  . Asthma in adult, moderate persistent, with acute exacerbation 05/06/2020    Past Surgical History:  Procedure Laterality Date  . WISDOM TOOTH EXTRACTION      Prior to Admission medications   Medication Sig Start Date End Date Taking? Authorizing Provider  albuterol (ACCUNEB) 1.25 MG/3ML nebulizer solution Take 1 ampule by nebulization every 4 (four) hours as needed. 05/04/20  Yes [provider]  albuterol (VENTOLIN HFA) 108 (90 Base) MCG/ACT inhaler Inhale 1-2 puffs into the lungs every 4 (four) hours as needed. 04/21/20  Yes [provider]  amphetamine-dextroamphetamine (ADDERALL XR) 20 MG 24 hr capsule Take 20 mg by mouth daily. 05/05/20  Yes [provider]  azelastine (ASTELIN) 0.1 % nasal spray Place 2 sprays into both nostrils 2 (two) times daily. 05/04/20  Yes  [provider]  BREO ELLIPTA 200-25 MCG/INH AEPB Inhale 1 puff into the lungs daily. 05/01/20  Yes [provider]  fexofenadine (ALLEGRA) 180 MG tablet Take 180 mg by mouth daily. 07/17/19 07/16/20 Yes [provider]  fluticasone (FLONASE) 50 MCG/ACT nasal spray Place 1 spray into both nostrils daily. 09/18/19 09/17/20 Yes [provider]  JUNEL 1/20 1-20 MG-MCG tablet Take 1 tablet by mouth daily. 01/25/20  Yes [provider]  montelukast (SINGULAIR) 10 MG tablet Take 10 mg by mouth at bedtime. 07/17/19 07/16/20 Yes [provider]  omalizumab Geoffry Paradise) 150 MG injection Inject 375 mg into the skin every 14 (fourteen) days. 01/06/20  Yes [provider]  EPINEPHrine 0.3 mg/0.3 mL IJ SOAJ injection INJECT 1 PEN IN THE MUSCLE ONE TIME AS DIRECTED 02/05/20   [provider]    Allergies Patient has no known allergies.  No family history on file.  Social History Social History   Tobacco Use  . Smoking status: Current Some Day Smoker    Types: Cigarettes  . Smokeless tobacco: Never Used  Vaping Use  . Vaping Use: Former  Substance Use Topics  . Alcohol use: Yes  . Drug use: Yes    Types: Marijuana    Review of Systems  Constitutional: Positive for fever. Eyes: No visual changes. ENT: No sore throat. Cardiovascular: Positive for chest pain. Respiratory: Positive for shortness of breath. Gastrointestinal: No abdominal pain.  No nausea, no vomiting.  No diarrhea.  No constipation. Genitourinary: Negative for dysuria. Musculoskeletal: Negative for back pain. Skin: Negative for rash.  Neurological: Negative for headaches, focal weakness or numbness.   ____________________________________________   PHYSICAL EXAM:  VITAL SIGNS: ED Triage Vitals [05/05/20 2253]  Enc Vitals Group     BP (!) 125/91     Pulse Rate (!) 136     Resp (!) 22     Temp 99.9 F (37.7 C)     Temp src      SpO2 100 %     Weight 125 lb  (56.7 kg)     Height 5\' 4"  (1.626 m)     Head Circumference      Peak Flow      Pain Score 8     Pain Loc      Pain Edu?      Excl. in GC?     Constitutional: Alert and oriented.  Ill appearing and in moderate acute distress. Eyes: Conjunctivae are normal. PERRL. EOMI. Head: Atraumatic. Nose: No congestion/rhinnorhea. Mouth/Throat: Mucous membranes are moist.   Neck: No stridor.   Cardiovascular: Tachycardic rate, regular rhythm. Grossly normal heart sounds.  Good peripheral circulation. Respiratory: Increased respiratory effort.  Retractions. Lungs diminished bibasilarly.  Tripoding. Gastrointestinal: Soft and nontender. No distention. No abdominal bruits. No CVA tenderness. Musculoskeletal: No lower extremity tenderness nor edema.  No joint effusions. Neurologic:  Normal speech and language. No gross focal neurologic deficits are appreciated.  Skin:  Skin is warm, dry and intact. No rash noted.  No petechiae. Psychiatric: Mood and affect are normal. Speech and behavior are normal.  ____________________________________________   LABS (all labs ordered are listed, but only abnormal results are displayed)  Labs Reviewed  LACTIC ACID, PLASMA - Abnormal; Notable for the following components:      Result Value   Lactic Acid, Venous 2.6 (*)    All other components within normal limits  LACTIC ACID, PLASMA - Abnormal; Notable for the following components:   Lactic Acid, Venous 2.0 (*)    All other components within normal limits  URINALYSIS, COMPLETE (UACMP) WITH MICROSCOPIC - Abnormal; Notable for the following components:   Color, Urine STRAW (*)    APPearance CLEAR (*)    All other components within normal limits  GLUCOSE, CAPILLARY - Abnormal; Notable for the following components:   Glucose-Capillary 123 (*)    All other components within normal limits  RESP PANEL BY RT-PCR (FLU A&B, COVID) ARPGX2  CULTURE, BLOOD (ROUTINE X 2)  CULTURE, BLOOD (ROUTINE X 2)  URINE CULTURE   MRSA PCR SCREENING  CBC WITH DIFFERENTIAL/PLATELET  BASIC METABOLIC PANEL  PROCALCITONIN  HIV ANTIBODY (ROUTINE TESTING W REFLEX)  BASIC METABOLIC PANEL  CBC  TROPONIN I (HIGH SENSITIVITY)  TROPONIN I (HIGH SENSITIVITY)   ____________________________________________  EKG  ED ECG REPORT I, Tiawanna Luchsinger J, the attending physician, personally viewed and interpreted this ECG.   Date: 05/05/2020  EKG Time: 2256  Rate: 133  Rhythm: sinus tachycardia  Axis: RAD  Intervals:none  ST&T Change: Nonspecific  ____________________________________________  RADIOLOGY 2257 J, personally viewed and evaluated these images (plain radiographs) as part of my medical decision making, as well as reviewing the written report by the radiologist.  ED MD interpretation: No acute cardiopulmonary process  Official radiology report(s): DG Chest 2 View  Result Date: 05/05/2020 CLINICAL DATA:  20 year old female with shortness of breath and chest pain. EXAM: CHEST - 2 VIEW COMPARISON:  Chest radiograph dated 12/13/2016. FINDINGS: The heart size and mediastinal contours are within normal limits. Both lungs are clear. The visualized skeletal structures are unremarkable. IMPRESSION:  No active cardiopulmonary disease. Electronically Signed   By: Elgie Collard M.D.   On: 05/05/2020 23:12    ____________________________________________   PROCEDURES  Procedure(s) performed (including Critical Care):  .1-3 Lead EKG Interpretation Performed by: Irean Hong, MD Authorized by: Irean Hong, MD     Interpretation: abnormal     ECG rate:  133   ECG rate assessment: tachycardic     Rhythm: sinus tachycardia     Ectopy: none     Conduction: normal   Comments:     Patient placed on cardiac monitor to evaluate for arrhythmias    CRITICAL CARE Performed by: Irean Hong   Total critical care time: 45 minutes  Critical care time was exclusive of separately billable procedures and treating  other patients.  Critical care was necessary to treat or prevent imminent or life-threatening deterioration.  Critical care was time spent personally by me on the following activities: development of treatment plan with patient and/or surrogate as well as nursing, discussions with consultants, evaluation of patient's response to treatment, examination of patient, obtaining history from patient or surrogate, ordering and performing treatments and interventions, ordering and review of laboratory studies, ordering and review of radiographic studies, pulse oximetry and re-evaluation of patient's condition. ____________________________________________   INITIAL IMPRESSION / ASSESSMENT AND PLAN / ED COURSE  As part of my medical decision making, I reviewed the following data within the electronic MEDICAL RECORD NUMBER Nursing notes reviewed and incorporated, Labs reviewed, EKG interpreted, Old chart reviewed, Radiograph reviewed, Discussed with admitting physician and Notes from prior ED visits     20 year old female with asthma presenting in respiratory distress. Differential includes, but is not limited to, viral syndrome, bronchitis including COPD exacerbation, pneumonia, reactive airway disease including asthma, CHF including exacerbation with or without pulmonary/interstitial edema, pneumothorax, ACS, thoracic trauma, and pulmonary embolism.  Patient in moderate to severe distress.  Will place on BiPAP, initiate DuoNeb treatments, IV Solu-Medrol, IV Magnesium.  Anticipate hospitalization  Clinical Course as of 05/06/20 0221  Fri May 06, 2020  0000 Patient looking more comfortable on BiPAP. [JS]  0014 Elevated lactic acid noted.  Meets sepsis criteria by vital signs.  Will initiate code sepsis and administer IV antibiotics for possible community-acquired pneumonia. [JS]    Clinical Course User Index [JS] Irean Hong, MD     ____________________________________________   FINAL CLINICAL  IMPRESSION(S) / ED DIAGNOSES  Final diagnoses:  Severe persistent asthma with exacerbation  Respiratory distress  Sepsis, due to unspecified organism, unspecified whether acute organ dysfunction present [facility])     ED Discharge Orders    None      *Please note:  Carianna Lague was evaluated in Emergency Department on 05/06/2020 for the symptoms described in the history of present illness. She was evaluated in the context of the global COVID-19 pandemic, which necessitated consideration that the patient might be at risk for infection with the SARS-CoV-2 virus that causes COVID-19. Institutional protocols and algorithms that pertain to the evaluation of patients at risk for COVID-19 are in a state of rapid change based on information released by regulatory bodies including the CDC and federal and state organizations. These policies and algorithms were followed during the patient's care in the ED.  Some ED evaluations and interventions may be delayed as a result of limited staffing during and the pandemic.*   Note:  This document was prepared using Dragon voice recognition software and may include unintentional dictation errors.   Irean Hong, MD  05/06/20 0221    Irean HongSung, Kamillah Didonato J, MD 05/06/20 [phone]

## 2020-05-05 NOTE — ED Notes (Signed)
Pt brought to ED15 from XR. Pt tachypneic, labored breathing. Unable to speak in full sentences d/t SOB. Pt reports she became SOB yesterday, as tried her breathing tx with no help. Hx asthma. Denies hx of intubation

## 2020-05-06 DIAGNOSIS — Z79899 Other long term (current) drug therapy: Secondary | ICD-10-CM | POA: Diagnosis not present

## 2020-05-06 DIAGNOSIS — F17210 Nicotine dependence, cigarettes, uncomplicated: Secondary | ICD-10-CM | POA: Diagnosis present

## 2020-05-06 DIAGNOSIS — J4541 Moderate persistent asthma with (acute) exacerbation: Secondary | ICD-10-CM | POA: Diagnosis present

## 2020-05-06 DIAGNOSIS — Z7952 Long term (current) use of systemic steroids: Secondary | ICD-10-CM | POA: Diagnosis not present

## 2020-05-06 DIAGNOSIS — F1290 Cannabis use, unspecified, uncomplicated: Secondary | ICD-10-CM | POA: Diagnosis present

## 2020-05-06 DIAGNOSIS — Z20822 Contact with and (suspected) exposure to covid-19: Secondary | ICD-10-CM | POA: Diagnosis present

## 2020-05-06 DIAGNOSIS — F909 Attention-deficit hyperactivity disorder, unspecified type: Secondary | ICD-10-CM | POA: Diagnosis present

## 2020-05-06 DIAGNOSIS — J9601 Acute respiratory failure with hypoxia: Secondary | ICD-10-CM | POA: Diagnosis present

## 2020-05-06 DIAGNOSIS — R0602 Shortness of breath: Secondary | ICD-10-CM | POA: Diagnosis present

## 2020-05-06 LAB — RESP PANEL BY RT-PCR (FLU A&B, COVID) ARPGX2
Influenza A by PCR: NEGATIVE
Influenza B by PCR: NEGATIVE
SARS Coronavirus 2 by RT PCR: NEGATIVE

## 2020-05-06 LAB — CBC
HCT: 36 % (ref 36.0–46.0)
Hemoglobin: 12.1 g/dL (ref 12.0–15.0)
MCH: 29.9 pg (ref 26.0–34.0)
MCHC: 33.6 g/dL (ref 30.0–36.0)
MCV: 88.9 fL (ref 80.0–100.0)
Platelets: 234 10*3/uL (ref 150–400)
RBC: 4.05 MIL/uL (ref 3.87–5.11)
RDW: 12.5 % (ref 11.5–15.5)
WBC: 7 10*3/uL (ref 4.0–10.5)
nRBC: 0 % (ref 0.0–0.2)

## 2020-05-06 LAB — BASIC METABOLIC PANEL
Anion gap: 9 (ref 5–15)
BUN: 9 mg/dL (ref 6–20)
CO2: 21 mmol/L — ABNORMAL LOW (ref 22–32)
Calcium: 8.3 mg/dL — ABNORMAL LOW (ref 8.9–10.3)
Chloride: 108 mmol/L (ref 98–111)
Creatinine, Ser: 0.92 mg/dL (ref 0.44–1.00)
GFR, Estimated: >60 mL/min (ref >60)
Glucose, Bld: 176 mg/dL — ABNORMAL HIGH (ref 70–99)
Potassium: 4 mmol/L (ref 3.5–5.1)
Sodium: 138 mmol/L (ref 135–145)

## 2020-05-06 LAB — URINALYSIS, COMPLETE (UACMP) WITH MICROSCOPIC
Bacteria, UA: NONE SEEN
Bilirubin Urine: NEGATIVE
Glucose, UA: NEGATIVE mg/dL
Hgb urine dipstick: NEGATIVE
Ketones, ur: NEGATIVE mg/dL
Leukocytes,Ua: NEGATIVE
Nitrite: NEGATIVE
Protein, ur: NEGATIVE mg/dL
Specific Gravity, Urine: 1.005 (ref 1.005–1.030)
pH: 8 (ref 5.0–8.0)

## 2020-05-06 LAB — LACTIC ACID, PLASMA
Lactic Acid, Venous: 2 mmol/L (ref 0.5–1.9)
Lactic Acid, Venous: 2.6 mmol/L (ref 0.5–1.9)

## 2020-05-06 LAB — HIV ANTIBODY (ROUTINE TESTING W REFLEX): HIV Screen 4th Generation wRfx: NONREACTIVE

## 2020-05-06 LAB — PROCALCITONIN: Procalcitonin: <0.1 ng/mL

## 2020-05-06 LAB — MRSA PCR SCREENING: MRSA by PCR: NEGATIVE

## 2020-05-06 LAB — TROPONIN I (HIGH SENSITIVITY): Troponin I (High Sensitivity): 3 ng/L (ref <18)

## 2020-05-06 LAB — GLUCOSE, CAPILLARY: Glucose-Capillary: 123 mg/dL — ABNORMAL HIGH (ref 70–99)

## 2020-05-06 MED ORDER — MONTELUKAST SODIUM 10 MG PO TABS
10.0000 mg | ORAL_TABLET | Freq: Every day | ORAL | Status: DC
  Administered 2020-05-06 – 2020-05-07 (×2): 10 mg via ORAL
  Filled 2020-05-06 (×2): qty 1

## 2020-05-06 MED ORDER — ORAL CARE MOUTH RINSE
15.0000 mL | Freq: Two times a day (BID) | OROMUCOSAL | Status: DC
  Administered 2020-05-06 – 2020-05-07 (×4): 15 mL via OROMUCOSAL

## 2020-05-06 MED ORDER — TRAMADOL HCL 50 MG PO TABS
50.0000 mg | ORAL_TABLET | Freq: Four times a day (QID) | ORAL | Status: DC | PRN
  Administered 2020-05-06 – 2020-05-07 (×3): 50 mg via ORAL
  Filled 2020-05-06 (×3): qty 1

## 2020-05-06 MED ORDER — MELATONIN 5 MG PO TABS
5.0000 mg | ORAL_TABLET | Freq: Every day | ORAL | Status: DC
  Administered 2020-05-06 – 2020-05-07 (×2): 5 mg via ORAL
  Filled 2020-05-06 (×3): qty 1

## 2020-05-06 MED ORDER — AMPHETAMINE-DEXTROAMPHET ER 5 MG PO CP24
20.0000 mg | ORAL_CAPSULE | Freq: Every day | ORAL | Status: DC
  Filled 2020-05-06 (×2): qty 1
  Filled 2020-05-06: qty 4
  Filled 2020-05-06: qty 1

## 2020-05-06 MED ORDER — PREDNISONE 10 MG PO TABS: 50.0000 mg | ORAL_TABLET | Freq: Every day | ORAL | Status: DC

## 2020-05-06 MED ORDER — SODIUM CHLORIDE 0.9 % IV SOLN
500.0000 mg | INTRAVENOUS | Status: DC
  Administered 2020-05-06: 500 mg via INTRAVENOUS
  Filled 2020-05-06: qty 500

## 2020-05-06 MED ORDER — FLUTICASONE FUROATE-VILANTEROL 200-25 MCG/INH IN AEPB
1.0000 | INHALATION_SPRAY | Freq: Every day | RESPIRATORY_TRACT | Status: DC
  Administered 2020-05-06 – 2020-05-08 (×3): 1 via RESPIRATORY_TRACT
  Filled 2020-05-06: qty 28

## 2020-05-06 MED ORDER — SODIUM CHLORIDE 0.9% FLUSH
3.0000 mL | Freq: Two times a day (BID) | INTRAVENOUS | Status: DC
  Administered 2020-05-06 – 2020-05-07 (×4): 3 mL via INTRAVENOUS

## 2020-05-06 MED ORDER — ALBUTEROL SULFATE (2.5 MG/3ML) 0.083% IN NEBU
2.5000 mg | INHALATION_SOLUTION | RESPIRATORY_TRACT | Status: DC | PRN
  Administered 2020-05-06 (×2): 2.5 mg via RESPIRATORY_TRACT
  Filled 2020-05-06 (×3): qty 3

## 2020-05-06 MED ORDER — SODIUM CHLORIDE 0.9 % IV BOLUS (SEPSIS)
250.0000 mL | Freq: Once | INTRAVENOUS | Status: AC
  Administered 2020-05-06: 250 mL via INTRAVENOUS

## 2020-05-06 MED ORDER — LACTATED RINGERS IV SOLN: INTRAVENOUS | Status: DC

## 2020-05-06 MED ORDER — CHLORHEXIDINE GLUCONATE CLOTH 2 % EX PADS
6.0000 | MEDICATED_PAD | Freq: Every day | CUTANEOUS | Status: DC
  Administered 2020-05-06: 6 via TOPICAL

## 2020-05-06 MED ORDER — CHLORHEXIDINE GLUCONATE 0.12 % MT SOLN
15.0000 mL | Freq: Two times a day (BID) | OROMUCOSAL | Status: DC
  Administered 2020-05-06: 15 mL via OROMUCOSAL
  Filled 2020-05-06: qty 15

## 2020-05-06 MED ORDER — SODIUM CHLORIDE 0.9 % IV BOLUS (SEPSIS)
500.0000 mL | Freq: Once | INTRAVENOUS | Status: AC
  Administered 2020-05-06: 500 mL via INTRAVENOUS

## 2020-05-06 MED ORDER — SODIUM CHLORIDE 0.9 % IV SOLN
2.0000 g | INTRAVENOUS | Status: DC
  Administered 2020-05-06: 2 g via INTRAVENOUS
  Filled 2020-05-06: qty 20

## 2020-05-06 MED ORDER — METHYLPREDNISOLONE SODIUM SUCC 40 MG IJ SOLR
40.0000 mg | Freq: Three times a day (TID) | INTRAMUSCULAR | Status: DC
  Administered 2020-05-06 – 2020-05-08 (×7): 40 mg via INTRAVENOUS
  Filled 2020-05-06 (×7): qty 1

## 2020-05-06 MED ORDER — ACETAMINOPHEN 650 MG RE SUPP: 650.0000 mg | Freq: Four times a day (QID) | RECTAL | Status: DC | PRN

## 2020-05-06 MED ORDER — ENOXAPARIN SODIUM 40 MG/0.4ML ~~LOC~~ SOLN
40.0000 mg | SUBCUTANEOUS | Status: DC
  Administered 2020-05-06 – 2020-05-07 (×2): 40 mg via SUBCUTANEOUS
  Filled 2020-05-06 (×3): qty 0.4

## 2020-05-06 MED ORDER — HYDROCODONE-HOMATROPINE 5-1.5 MG/5ML PO SYRP
5.0000 mL | ORAL_SOLUTION | Freq: Four times a day (QID) | ORAL | Status: DC | PRN
  Administered 2020-05-06 – 2020-05-07 (×4): 5 mL via ORAL
  Filled 2020-05-06 (×4): qty 5

## 2020-05-06 MED ORDER — IPRATROPIUM-ALBUTEROL 0.5-2.5 (3) MG/3ML IN SOLN
3.0000 mL | Freq: Four times a day (QID) | RESPIRATORY_TRACT | Status: DC
  Administered 2020-05-06 – 2020-05-08 (×8): 3 mL via RESPIRATORY_TRACT
  Filled 2020-05-06 (×8): qty 3

## 2020-05-06 MED ORDER — ACETAMINOPHEN 325 MG PO TABS: 650.0000 mg | ORAL_TABLET | Freq: Four times a day (QID) | ORAL | Status: DC | PRN

## 2020-05-06 MED ORDER — DM-GUAIFENESIN ER 30-600 MG PO TB12
1.0000 | ORAL_TABLET | Freq: Two times a day (BID) | ORAL | Status: DC
  Administered 2020-05-06 – 2020-05-08 (×5): 1 via ORAL
  Filled 2020-05-06 (×5): qty 1

## 2020-05-06 NOTE — Plan of Care (Signed)
°  Problem: Clinical Measurements: °Goal: Ability to maintain clinical measurements within normal limits will improve °Outcome: Progressing °Goal: Will remain free from infection °Outcome: Progressing °Goal: Respiratory complications will improve °Outcome: Progressing °  °Problem: Activity: °Goal: Risk for activity intolerance will decrease °Outcome: Progressing °  °Problem: Coping: °Goal: Level of anxiety will decrease °Outcome: Progressing °  °

## 2020-05-06 NOTE — ED Notes (Signed)
Report given to [facility] for [employer]. RT called to help transport pt

## 2020-05-06 NOTE — Progress Notes (Signed)
CODE SEPSIS - PHARMACY COMMUNICATION  **Broad Spectrum Antibiotics should be administered within 1 hour of Sepsis diagnosis**  Time Code Sepsis Called/Page Received: 0016  Antibiotics Ordered: Azithromycin, Ceftriaxone  Time of 1st antibiotic administration: 0056   Otelia Sergeant, PharmD, [facility] 05/06/2020 12:40 AM

## 2020-05-06 NOTE — ED Notes (Signed)
Pt tolerating BiPAP well, provided blanket per request

## 2020-05-06 NOTE — Progress Notes (Signed)
PROGRESS NOTE    Sophia Webster  GUY:[id number] DOB: 04-07-2000 DOA: 05/05/2020 PCP: Pcp, No    Brief Narrative:  Sophia Webster is a 20 year old female with past medical history significant for asthma, ADHD who presented to [facility] ED on 05/05/2020 with progressive shortness of breath.  Patient also reports fever 102.4 at home associated with nonproductive cough.  Patient has been taking OTC cold medications and utilizing her home nebs without improvement of her symptoms.  Patient reports previous hospitalizations for asthma exacerbations in the past but never needed BiPAP or intubation.  Patient reports vaccination against Covid-19.  Further denies chills/abdominal pain, no constipation, no diarrhea, no nausea/vomiting.  In the ED, temperature 99.9 F, HR 136, RR 22, BP 125/91.  On ED arrival, patient was able to speak in full sentences and with significant respiratory distress and subsequently was placed on BiPAP.  WBC 7.7, hemoglobin 13.0, platelets 276.  Sodium 136, potassium 3.7, chloride 104, CO2 23, creatinine 0.83, BUN 7, glucose 97.  High-sensitivity troponin 3.  Lactic acid 2.6, procalcitonin less than 0.10.  SARS-CoV-2 PCR/influenza A/B PCR negative.  Chest x-ray with no acute cardiopulmonary disease process.  Given patient's significant respiratory distress, increased work of breathing; patient was placed on BiPAP.  Received Solu-Medrol, magnesium IV, duo nebs.  Hospitalist service consulted for further evaluation management.   Assessment & Plan:   Active Problems:   Asthma in adult, moderate persistent, with acute exacerbation   Acute hypoxic respiratory failure, POA Asthma exacerbation Patient presenting to ED with progressive shortness of breath associated with nonproductive cough, fevers.  Was in significant respiratory distress upon ED arrival and was subsequently placed on BiPAP due to increased work of breathing.  Patient received IV magnesium, IV Solu-Medrol and duo nebs with  improvement of symptoms.  BiPAP was titrated off and transition to high flow nasal cannula at 6 L/min. --Solu-Medrol 40 mg IV every 8 hours --DuoNeb every 6 hours while awake --Albuterol neb every 2 hours as needed wheezing/shortness of breath --Breo-Elipta 200-25 mcg 12 puff daily --Singular 10 mg p.o. nightly --Hycodan cough syrup every 6 hours as needed cough --Continue supplemental oxygen, maintain SPO2 greater than 92% --Supportive care, monitor fever curve --Ambulatory O2 assessment tomorrow morning  ADHD --Continue Adderall XR 20 mg p.o. daily   DVT prophylaxis: Lovenox   Code Status: Full Code Family Communication: No family present at bedside this am  Disposition Plan:  Level of care: Stepdown, transfer to med/surg Status is: Inpatient  Remains inpatient appropriate because:Unsafe d/c plan, IV treatments appropriate due to intensity of illness or inability to take PO and Inpatient level of care appropriate due to severity of illness   Dispo: The patient is from: Home              Anticipated d/c is to: Home              Patient currently is not medically stable to d/c.   Difficult to place patient No   Consultants:   None  Procedures:   None  Antimicrobials:   None   Subjective: Patient seen and examined bedside, resting comfortably.  Continues in stepdown unit.  Titrated off of BiPAP overnight, continues on 6 L high flow nasal cannula with SPO2 100%.  States her breathing and shortness of breath much improved.  No specific complaints this morning.  Denies headache, no recurrent fevers, no chills/night sweats, no nausea/vomiting/diarrhea, no chest pain, no palpitations, no abdominal pain, no weakness, no fatigue, no paresthesias.  No  acute events overnight per nursing staff.  Stable for transfer to MedSurg today.  Objective: Vitals:   05/06/20 0800 05/06/20 0900 05/06/20 1008 05/06/20 1206  BP: 120/71 130/74 120/66 113/67  Pulse: 90 95 77 63  Resp: 16 15  16 18   Temp: 98.7 F (37.1 C)  98.4 F (36.9 C) 97.6 F (36.4 C)  TempSrc: Oral     SpO2: 100% 100% 100% 100%  Weight:      Height:        Intake/Output Summary (Last 24 hours) at 05/06/2020 1410 Last data filed at 05/06/2020 0800 Gross per 24 hour  Intake 729.99 ml  Output 2700 ml  Net -1970.01 ml   Filed Weights   05/05/20 2253 05/06/20 0200  Weight: 56.7 kg 58.5 kg    Examination:  General exam: Appears calm and comfortable  Respiratory system: Mild late expiratory wheezing, normal respiratory effort without accessory muscle use, on 6 L high flow nasal cannula Cardiovascular system: S1 & S2 heard, RRR. No JVD, murmurs, rubs, gallops or clicks. No pedal edema. Gastrointestinal system: Abdomen is nondistended, soft and nontender. No organomegaly or masses felt. Normal bowel sounds heard. Central nervous system: Alert and oriented. No focal neurological deficits. Extremities: Symmetric 5 x 5 power. Skin: No rashes, lesions or ulcers Psychiatry: Judgement and insight appear normal. Mood & affect appropriate.     Data Reviewed: I have personally reviewed following labs and imaging studies  CBC: Recent Labs  Lab 05/05/20 2256 05/06/20 0308  WBC 7.7 7.0  NEUTROABS 6.0  --   HGB 13.0 12.1  HCT 38.2 36.0  MCV 88.0 88.9  PLT 276 234   Basic Metabolic Panel: Recent Labs  Lab 05/05/20 2256 05/06/20 0308  NA 136 138  K 3.7 4.0  CL 104 108  CO2 23 21*  GLUCOSE 97 176*  BUN 7 9  CREATININE 0.83 0.92  CALCIUM 9.1 8.3*   GFR: Estimated Creatinine Clearance: 82.8 mL/min (by C-G formula based on SCr of 0.92 mg/dL). Liver Function Tests: No results for input(s): AST, ALT, ALKPHOS, BILITOT, PROT, ALBUMIN in the last 168 hours. No results for input(s): LIPASE, AMYLASE in the last 168 hours. No results for input(s): AMMONIA in the last 168 hours. Coagulation Profile: No results for input(s): INR, PROTIME in the last 168 hours. Cardiac Enzymes: No results for input(s):  CKTOTAL, CKMB, CKMBINDEX, TROPONINI in the last 168 hours. BNP (last 3 results) No results for input(s): PROBNP in the last 8760 hours. HbA1C: No results for input(s): HGBA1C in the last 72 hours. CBG: Recent Labs  Lab 05/06/20 0144  GLUCAP 123*   Lipid Profile: No results for input(s): CHOL, HDL, LDLCALC, TRIG, CHOLHDL, LDLDIRECT in the last 72 hours. Thyroid Function Tests: No results for input(s): TSH, T4TOTAL, FREET4, T3FREE, THYROIDAB in the last 72 hours. Anemia Panel: No results for input(s): VITAMINB12, FOLATE, FERRITIN, TIBC, IRON, RETICCTPCT in the last 72 hours. Sepsis Labs: Recent Labs  Lab 05/05/20 2326 05/06/20 0058  PROCALCITON <0.10  --   LATICACIDVEN 2.6* 2.0*    Recent Results (from the past 240 hour(s))  Culture, blood (routine x 2)     Status: None (Preliminary result)   Collection Time: 05/05/20 11:25 PM   Specimen: BLOOD  Result Value Ref Range Status   Specimen Description BLOOD BLOOD LEFT FOREARM  Final   Special Requests   Final    BOTTLES DRAWN AEROBIC AND ANAEROBIC Blood Culture results may not be optimal due to an excessive volume of blood  received in culture bottles   Culture   Final    NO GROWTH < 12 HOURS Performed at [facility], [address] [place], [place] [address]    Report Status PENDING  Incomplete  Resp Panel by RT-PCR (Flu A&B, Covid) Nasopharyngeal Swab     Status: None   Collection Time: 05/05/20 11:27 PM   Specimen: Nasopharyngeal Swab; Nasopharyngeal(NP) swabs in vial transport medium  Result Value Ref Range Status   SARS Coronavirus 2 by RT PCR NEGATIVE NEGATIVE Final    Comment: (NOTE) SARS-CoV-2 target nucleic acids are NOT DETECTED.  The SARS-CoV-2 RNA is generally detectable in upper respiratory specimens during the acute phase of infection. The lowest concentration of SARS-CoV-2 viral copies this assay can detect is 138 copies/mL. A negative result does not preclude SARS-Cov-2 infection and should not  be used as the sole basis for treatment or other patient management decisions. A negative result may occur with  improper specimen collection/handling, submission of specimen other than nasopharyngeal swab, presence of viral mutation(s) within the areas targeted by this assay, and inadequate number of viral copies(<138 copies/mL). A negative result must be combined with clinical observations, patient history, and epidemiological information. The expected result is Negative.  Fact Sheet for Patients:  BloggerCourse.com  Fact Sheet for Healthcare Providers:  SeriousBroker.it  This test is no t yet approved or cleared by the [place] FDA and  has been authorized for detection and/or diagnosis of SARS-CoV-2 by FDA under an Emergency Use Authorization (EUA). This EUA will remain  in effect (meaning this test can be used) for the duration of the COVID-19 declaration under Section 564(b)(1) of the Act, 21 U.S.C.section 360bbb-3(b)(1), unless the authorization is terminated  or revoked sooner.       Influenza A by PCR NEGATIVE NEGATIVE Final   Influenza B by PCR NEGATIVE NEGATIVE Final    Comment: (NOTE) The Xpert Xpress SARS-CoV-2/FLU/RSV plus assay is intended as an aid in the diagnosis of influenza from Nasopharyngeal swab specimens and should not be used as a sole basis for treatment. Nasal washings and aspirates are unacceptable for Xpert Xpress SARS-CoV-2/FLU/RSV testing.  Fact Sheet for Patients: BloggerCourse.com  Fact Sheet for Healthcare Providers: SeriousBroker.it  This test is not yet approved or cleared by the [place] FDA and has been authorized for detection and/or diagnosis of SARS-CoV-2 by FDA under an Emergency Use Authorization (EUA). This EUA will remain in effect (meaning this test can be used) for the duration of the COVID-19 declaration under Section  564(b)(1) of the Act, 21 U.S.C. section 360bbb-3(b)(1), unless the authorization is terminated or revoked.  Performed at [facility], [address] [place], [place] [address]   Culture, blood (routine x 2)     Status: None (Preliminary result)   Collection Time: 05/06/20 12:41 AM   Specimen: BLOOD  Result Value Ref Range Status   Specimen Description BLOOD LEFT ANTECUBITAL  Final   Special Requests   Final    BOTTLES DRAWN AEROBIC AND ANAEROBIC Blood Culture adequate volume   Culture   Final    NO GROWTH < 12 HOURS Performed at [facility], [address], [place], [place] [address]    Report Status PENDING  Incomplete  MRSA PCR Screening     Status: None   Collection Time: 05/06/20  1:55 AM   Specimen: Urine, Clean Catch; Nasopharyngeal  Result Value Ref Range Status   MRSA by PCR NEGATIVE NEGATIVE Final    Comment:  The GeneXpert MRSA Assay (FDA approved for NASAL specimens only), is one component of a comprehensive MRSA colonization surveillance program. It is not intended to diagnose MRSA infection nor to guide or monitor treatment for MRSA infections. Performed at [facility], [address] [place], [place] [id number]          Radiology Studies: DG Chest 2 View  Result Date: 05/05/2020 CLINICAL DATA:  20 year old female with shortness of breath and chest pain. EXAM: CHEST - 2 VIEW COMPARISON:  Chest radiograph dated 12/13/2016. FINDINGS: The heart size and mediastinal contours are within normal limits. Both lungs are clear. The visualized skeletal structures are unremarkable. IMPRESSION: No active cardiopulmonary disease. Electronically Signed   By: Elgie CollardArash  Radparvar M.D.   On: 05/05/2020 23:12        Scheduled Meds: . chlorhexidine  15 mL Mouth Rinse BID  . Chlorhexidine Gluconate Cloth  6 each Topical Q0600  . dextromethorphan-guaiFENesin  1 tablet Oral BID  . enoxaparin (LOVENOX) injection  40 mg Subcutaneous Q24H   . fluticasone furoate-vilanterol  1 puff Inhalation Daily  . ipratropium-albuterol  3 mL Nebulization Q6H WA  . mouth rinse  15 mL Mouth Rinse q12n4p  . melatonin  5 mg Oral QHS  . methylPREDNISolone (SOLU-MEDROL) injection  40 mg Intravenous Q8H  . montelukast  10 mg Oral QHS  . sodium chloride flush  3 mL Intravenous Q12H   Continuous Infusions:   LOS: 0 days    Time spent: 39 minutes spent on chart review, discussion with nursing staff, consultants, updating family and interview/physical exam; more than 50% of that time was spent in counseling and/or coordination of care.    Alvira PhilipsEric J [place], DO [facility] Available via Epic secure chat 7am-7pm After these hours, please refer to coverage provider listed on [email or web address] 05/06/2020, 2:10 PM

## 2020-05-06 NOTE — Sepsis Progress Note (Signed)
Monitoring for code sepsis protocol. 

## 2020-05-06 NOTE — H&P (Signed)
History and Physical   Sophia Webster GDJ:[id number] DOB: 2000-11-24 DOA: 05/05/2020  PCP: Pcp, No   Patient coming from: Home  Chief Complaint: Shortness of breath  HPI: Sophia Webster is a 20 y.o. female with medical history significant of asthma and ADHD who presents with shortness of breath.  Patient states that she has had some cold-like symptoms for the past several days with associated nonproductive cough and fever.  She states she recorded a fever as high as 102.4 at home today.  She has been taking cold medications and her home nebulizers without improvement in her shortness of breath. She has had hospitalizations for asthma exacerbations in the past, but states she has not needed BiPAP before.  She reports some chest pain associated with her coughing episodes.  She has been vaccinated against COVID-19.  She reports having recently tested negative for flu and COVID.  She denies fevers, chills, abdominal pain, constipation, diarrhea, nausea, vomiting.  ED Course: Vital signs in the ED significant for respiratory rate in the 20s, heart rate initially in the 130s now improving to the 100s, blood pressure in the 120s to 150 systolic.  Lab work-up showed normal BMP and normal CBC.  Initial troponin normal.  Initial lactic acid 2.6 with repeat pending following fluids.  Respiratory panel for flu and COVID pending.  Procalcitonin and blood cultures also ordered and pending.  Chest x-ray without acute unreality.  In the ED patient received Solu-Medrol, magnesium, duo nebs, and was placed on BiPAP due to increased work of breathing.  Patient has become more comfortable now on BiPAP.  Review of Systems: As per HPI otherwise all other systems reviewed and are negative.  Past Medical History:  Diagnosis Date  . ADHD (attention deficit hyperactivity disorder)   . Asthma     Past Surgical History:  Procedure Laterality Date  . WISDOM TOOTH EXTRACTION      Social History  reports that she has been  smoking cigarettes. She has never used smokeless tobacco. She reports current alcohol use. She reports current drug use. Drug: Marijuana.  No Known Allergies  No family history on file. No pertinent family history per patient and her mother.  No family history of asthma.  Prior to Admission medications   Medication Sig Start Date End Date Taking? Authorizing Provider  aluminum-magnesium hydroxide-simethicone (MAALOX) 200-200-20 MG/5ML SUSP Take 30 mLs by mouth 4 (four) times daily -  before meals and at bedtime. 10/08/17   Sharman Cheek, MD  dicyclomine (BENTYL) 20 MG tablet Take 1 tablet (20 mg total) by mouth 3 (three) times daily as needed for spasms. 10/08/17   Sharman Cheek, MD  famotidine (PEPCID) 20 MG tablet Take 1 tablet (20 mg total) by mouth 2 (two) times daily. 10/08/17   Sharman Cheek, MD  lidocaine (XYLOCAINE) 2 % solution Use as directed 20 mLs every 2 (two) hours as needed in the mouth or throat for mouth pain. Gargle and spit out 12/13/16   Sharman Cheek, MD  naproxen (NAPROSYN) 500 MG tablet Take 1 tablet (500 mg total) by mouth 2 (two) times daily with a meal. 10/08/17   Sharman Cheek, MD  ondansetron (ZOFRAN ODT) 4 MG disintegrating tablet Take 1 tablet (4 mg total) by mouth every 8 (eight) hours as needed for nausea or vomiting. 10/08/17   Sharman Cheek, MD  predniSONE (DELTASONE) 20 MG tablet Take 2 tablets (40 mg total) daily by mouth. 12/13/16   Sharman Cheek, MD    Physical Exam: Vitals:  05/05/20 2253 05/05/20 2315 05/05/20 2332 05/06/20 0000  BP: (!) 125/91 115/78 128/82 (!) 151/75  Pulse: (!) 136 (!) 109 (!) 126 (!) 128  Resp: (!) 22 (!) 41 (!) 27 (!) 28  Temp: 99.9 F (37.7 C)     SpO2: 100% 100% 100% 100%  Weight: 56.7 kg     Height: 5\' 4"  (1.626 m)      Physical Exam Constitutional:      General: She is not in acute distress.    Appearance: Normal appearance.  HENT:     Head: Normocephalic and atraumatic.     Mouth/Throat:      Mouth: Mucous membranes are moist.     Pharynx: Oropharynx is clear.  Eyes:     Extraocular Movements: Extraocular movements intact.     Pupils: Pupils are equal, round, and reactive to light.  Cardiovascular:     Rate and Rhythm: Regular rhythm. Tachycardia present.     Pulses: Normal pulses.     Heart sounds: Normal heart sounds.  Pulmonary:     Effort: No respiratory distress.     Breath sounds: Wheezing (trace) present.     Comments: Mechanical breath sounds on BiPAP Abdominal:     General: Bowel sounds are normal. There is no distension.     Palpations: Abdomen is soft.     Tenderness: There is no abdominal tenderness.  Musculoskeletal:        General: No swelling or deformity.  Skin:    General: Skin is warm and dry.  Neurological:     General: No focal deficit present.     Mental Status: Mental status is at baseline.    Labs on Admission: I have personally reviewed following labs and imaging studies  CBC: Recent Labs  Lab 05/05/20 2256  WBC 7.7  NEUTROABS 6.0  HGB 13.0  HCT 38.2  MCV 88.0  PLT 276    Basic Metabolic Panel: Recent Labs  Lab 05/05/20 2256  NA 136  K 3.7  CL 104  CO2 23  GLUCOSE 97  BUN 7  CREATININE 0.83  CALCIUM 9.1    GFR: Estimated Creatinine Clearance: 91.8 mL/min (by C-G formula based on SCr of 0.83 mg/dL).  Liver Function Tests: No results for input(s): AST, ALT, ALKPHOS, BILITOT, PROT, ALBUMIN in the last 168 hours.  Urine analysis:    Component Value Date/Time   COLORURINE YELLOW (A) 10/08/2017 1641   APPEARANCEUR CLEAR (A) 10/08/2017 1641   LABSPEC 1.015 10/08/2017 1641   PHURINE 6.0 10/08/2017 1641   GLUCOSEU NEGATIVE 10/08/2017 1641   HGBUR LARGE (A) 10/08/2017 1641   BILIRUBINUR NEGATIVE 10/08/2017 1641   KETONESUR NEGATIVE 10/08/2017 1641   PROTEINUR NEGATIVE 10/08/2017 1641   NITRITE NEGATIVE 10/08/2017 1641   LEUKOCYTESUR NEGATIVE 10/08/2017 1641    Radiological Exams on Admission: DG Chest 2  View  Result Date: 05/05/2020 CLINICAL DATA:  20 year old female with shortness of breath and chest pain. EXAM: CHEST - 2 VIEW COMPARISON:  Chest radiograph dated 12/13/2016. FINDINGS: The heart size and mediastinal contours are within normal limits. Both lungs are clear. The visualized skeletal structures are unremarkable. IMPRESSION: No active cardiopulmonary disease. Electronically Signed   By: 12/15/2016 M.D.   On: 05/05/2020 23:12    EKG: Independently reviewed.  Sinus tachycardia at 133 bpm.  Some nonspecific ST changes.  Assessment/Plan Active Problems:   Asthma in adult, moderate persistent, with acute exacerbation  Asthma exacerbation > Patient with known history of asthma followed by [facility] pulmonology.  Currently on Xolair, Singulair, Breo and as needed albuterol outpatient. > Present with URI symptoms and shortness of breath found to have increased work of breathing and wheezing. > Received Solu-Medrol, magnesium, duo nebs in the ED and was placed on BiPAP due to increased work of breathing has since improved on BiPAP. > Mild lactic acid doses less than 3 on first check in ED likely due to hypoxia secondary to severe asthma exacerbation, will trend. - Monitor in progressive unit with as needed BiPAP - Continue home Breo and Singulair - Prednisone 50 mg daily starting tomorrow - DuoNebs every 6 hours while awake - Albuterol every 2 hours as needed - Follow-up viral panel for Covid and flu.  If negative check full RVP.  DVT prophylaxis: Lovenox  Code Status:   Full  Family Communication:  Mother updated via video chat at the time of patient's exam. Disposition Plan:   Patient is from:  Home  Anticipated DC to:  Home  Anticipated DC date:  1 to 2 days  Anticipated DC barriers: None  Consults called:  None  Admission status:  Observation, progressive   Severity of Illness: The appropriate patient status for this patient is OBSERVATION. Observation status is judged to be  reasonable and necessary in order to provide the required intensity of service to ensure the patient's safety. The patient's presenting symptoms, physical exam findings, and initial radiographic and laboratory data in the context of their medical condition is felt to place them at decreased risk for further clinical deterioration. Furthermore, it is anticipated that the patient will be medically stable for discharge from the hospital within 2 midnights of admission. The following factors support the patient status of observation.   " The patient's presenting symptoms include shortness of breath, fever, cough. " The physical exam findings include wheezing, tachycardia. " The initial radiographic and laboratory data are lactic acid 2.6..   Synetta Fail MD [facility]  How to contact the [facility] Attending or Consulting provider 7A - 7P or covering provider during after hours 7P -7A, for this patient?   1. Check the care team in [facility] and look for a) attending/consulting [facility] provider listed and b) the [facility] team listed 2. Log into [email or web address] and use [place]'s universal password to access. If you do not have the password, please contact the hospital operator. 3. Locate the [facility] provider you are looking for under [facility] and page to a number that you can be directly reached. 4. If you still have difficulty reaching the provider, please page the [facility] (Director on Call) for the Hospitalists listed on amion for assistance.  05/06/2020, 12:46 AM

## 2020-05-06 NOTE — Progress Notes (Signed)
Cross cover Patient continues to improve with work of breathing and tolerating wean of support to HFNC 6L from BIPAP.  She is still experiencing shortness of breath with activity.  Procalcitonin - not elevated, antibiotics discontinued  corticosteroids increased mucinex added for cough  Bedside she reports breathing is much better, sats 1000% on the HFNC   Continue current treatments Continue to wean oxygen Asthma education trasfer to [employer]

## 2020-05-07 DIAGNOSIS — J4541 Moderate persistent asthma with (acute) exacerbation: Principal | ICD-10-CM

## 2020-05-07 LAB — URINE CULTURE: Culture: <10000 — AB

## 2020-05-07 MED ORDER — AMPHETAMINE-DEXTROAMPHET ER 20 MG PO CP24
20.0000 mg | ORAL_CAPSULE | Freq: Every day | ORAL | Status: DC | PRN
  Filled 2020-05-07: qty 1

## 2020-05-07 NOTE — Progress Notes (Signed)
Patient ambulated x2 around the nursing unit.  O2 sats remained in the 100% during ambulation, HR 115-120.  She did reports feeling very tired, winded, and throat congestion and had to go back to bed.  She feels she needs to stay in the hospital today.  Dr. Lyndel Safe has been notified.  Per MD patient will remain in hospital today. Keep off O2.

## 2020-05-07 NOTE — Progress Notes (Signed)
PROGRESS NOTE    Sophia Webster  CWC:[id number] DOB: 15-Jun-2000 DOA: 05/05/2020 PCP: Pcp, No    Brief Narrative:  Sophia Webster is a 20 year old female with past medical history significant for asthma, ADHD who presented to [facility] ED on 05/05/2020 with progressive shortness of breath.  Patient also reports fever 102.4 at home associated with nonproductive cough.  Patient has been taking OTC cold medications and utilizing her home nebs without improvement of her symptoms.  Patient reports previous hospitalizations for asthma exacerbations in the past but never needed BiPAP or intubation.  Patient reports vaccination against Covid-19.  Further denies chills/abdominal pain, no constipation, no diarrhea, no nausea/vomiting.  In the ED, temperature 99.9 F, HR 136, RR 22, BP 125/91.  On ED arrival, patient was able to speak in full sentences and with significant respiratory distress and subsequently was placed on BiPAP.  WBC 7.7, hemoglobin 13.0, platelets 276.  Sodium 136, potassium 3.7, chloride 104, CO2 23, creatinine 0.83, BUN 7, glucose 97.  High-sensitivity troponin 3.  Lactic acid 2.6, procalcitonin less than 0.10.  SARS-CoV-2 PCR/influenza A/B PCR negative.  Chest x-ray with no acute cardiopulmonary disease process.  Given patient's significant respiratory distress, increased work of breathing; patient was placed on BiPAP.  Received Solu-Medrol, magnesium IV, duo nebs.  Hospitalist service consulted for further evaluation management.   Assessment & Plan:   Active Problems:   Asthma in adult, moderate persistent, with acute exacerbation   Acute hypoxic respiratory failure, POA. Asthma exacerbation Patient presenting to ED with progressive shortness of breath associated with nonproductive cough, fevers.  Was in significant respiratory distress upon ED arrival and was subsequently placed on BiPAP due to increased work of breathing.  Patient received IV magnesium, IV Solu-Medrol and duo nebs with  improvement of symptoms.  Patient on BiPAP.  Today able to come off the oxygen.  Still with significant tachycardia and weakness on mobility. --Continue Solu-Medrol 40 mg IV every 8 hours --DuoNeb every 6 hours while awake --Albuterol neb every 2 hours as needed wheezing/shortness of breath --Breo-Elipta 200-25 mcg 12 puff daily --Singular 10 mg p.o. nightly --Hycodan cough syrup every 6 hours as needed cough --Supportive care, monitor fever curve --continue mobilizing multiple times today.  Incentive spirometry.  ADHD --Continue Adderall XR 20 mg p.o. daily   DVT prophylaxis: Lovenox   Code Status: Full Code Family Communication: Mother at the bedside.  Disposition Plan:  Level of care: MedSurg bed. Status is: Inpatient  Remains inpatient appropriate because:Unsafe d/c plan, IV treatments appropriate due to intensity of illness or inability to take PO and Inpatient level of care appropriate due to severity of illness   Dispo: The patient is from: Home              Anticipated d/c is to: Home              Patient currently is not medically stable to d/c.   Difficult to place patient No   Consultants:   None  Procedures:   None  Antimicrobials:   None   Subjective: Patient seen and examined.  Breathing much better.  Was a still on oxygen.  Later on, we mobilized her in the hallway.  Her oxygen remained 100% however patient complained of tightness in her throat as well as extreme fatigue.  Mother at the bedside.  It hurts on coughing especially neck muscles.  No sputum production.  No fever for last 24 hours. Objective: Vitals:   05/07/20 0113 05/07/20 0400 05/07/20 0405  05/07/20 0858  BP: 118/78  113/63 115/76  Pulse: 78 64 68 69  Resp: 16 16 14 16   Temp: 98.6 F (37 C)  (!) 97.3 F (36.3 C) 97.9 F (36.6 C)  TempSrc: Oral  Oral Oral  SpO2: 100% 100% 100% 100%  Weight:      Height:        Intake/Output Summary (Last 24 hours) at 05/07/2020 1030 Last data  filed at 05/07/2020 1002 Gross per 24 hour  Intake 500 ml  Output --  Net 500 ml   Filed Weights   05/05/20 2253 05/06/20 0200  Weight: 56.7 kg 58.5 kg    Examination: General: Comfortable looking, slightly anxious.  On room air on mobility. Cardiovascular: S1-S2 normal.  Tachycardic. Respiratory: Bilateral clear.  No added sound. Gastrointestinal: Soft and nontender. Ext: No edema or cyanosis. Neuro: No focal deficits. Musculoskeletal: No deformities.  No swelling.     Data Reviewed: I have personally reviewed following labs and imaging studies  CBC: Recent Labs  Lab 05/05/20 2256 05/06/20 0308  WBC 7.7 7.0  NEUTROABS 6.0  --   HGB 13.0 12.1  HCT 38.2 36.0  MCV 88.0 88.9  PLT 276 234   Basic Metabolic Panel: Recent Labs  Lab 05/05/20 2256 05/06/20 0308  NA 136 138  K 3.7 4.0  CL 104 108  CO2 23 21*  GLUCOSE 97 176*  BUN 7 9  CREATININE 0.83 0.92  CALCIUM 9.1 8.3*   GFR: Estimated Creatinine Clearance: 82.8 mL/min (by C-G formula based on SCr of 0.92 mg/dL). Liver Function Tests: No results for input(s): AST, ALT, ALKPHOS, BILITOT, PROT, ALBUMIN in the last 168 hours. No results for input(s): LIPASE, AMYLASE in the last 168 hours. No results for input(s): AMMONIA in the last 168 hours. Coagulation Profile: No results for input(s): INR, PROTIME in the last 168 hours. Cardiac Enzymes: No results for input(s): CKTOTAL, CKMB, CKMBINDEX, TROPONINI in the last 168 hours. BNP (last 3 results) No results for input(s): PROBNP in the last 8760 hours. HbA1C: No results for input(s): HGBA1C in the last 72 hours. CBG: Recent Labs  Lab 05/06/20 0144  GLUCAP 123*   Lipid Profile: No results for input(s): CHOL, HDL, LDLCALC, TRIG, CHOLHDL, LDLDIRECT in the last 72 hours. Thyroid Function Tests: No results for input(s): TSH, T4TOTAL, FREET4, T3FREE, THYROIDAB in the last 72 hours. Anemia Panel: No results for input(s): VITAMINB12, FOLATE, FERRITIN, TIBC, IRON,  RETICCTPCT in the last 72 hours. Sepsis Labs: Recent Labs  Lab 05/05/20 2326 05/06/20 0058  PROCALCITON <0.10  --   LATICACIDVEN 2.6* 2.0*    Recent Results (from the past 240 hour(s))  Culture, blood (routine x 2)     Status: None (Preliminary result)   Collection Time: 05/05/20 11:25 PM   Specimen: BLOOD  Result Value Ref Range Status   Specimen Description BLOOD BLOOD LEFT FOREARM  Final   Special Requests   Final    BOTTLES DRAWN AEROBIC AND ANAEROBIC Blood Culture results may not be optimal due to an excessive volume of blood received in culture bottles   Culture   Final    NO GROWTH 2 DAYS Performed at [facility], [address], [place], [place]    Report Status PENDING  Incomplete  Resp Panel by RT-PCR (Flu A&B, Covid) Nasopharyngeal Swab     Status: None   Collection Time: 05/05/20 11:27 PM   Specimen: Nasopharyngeal Swab; Nasopharyngeal(NP) swabs in vial transport medium  Result Value Ref Range  Status   SARS Coronavirus 2 by RT PCR NEGATIVE NEGATIVE Final    Comment: (NOTE) SARS-CoV-2 target nucleic acids are NOT DETECTED.  The SARS-CoV-2 RNA is generally detectable in upper respiratory specimens during the acute phase of infection. The lowest concentration of SARS-CoV-2 viral copies this assay can detect is 138 copies/mL. A negative result does not preclude SARS-Cov-2 infection and should not be used as the sole basis for treatment or other patient management decisions. A negative result may occur with  improper specimen collection/handling, submission of specimen other than nasopharyngeal swab, presence of viral mutation(s) within the areas targeted by this assay, and inadequate number of viral copies(<138 copies/mL). A negative result must be combined with clinical observations, patient history, and epidemiological information. The expected result is Negative.  Fact Sheet for Patients:  BloggerCourse.com  Fact  Sheet for Healthcare Providers:  SeriousBroker.it  This test is no t yet approved or cleared by the [place] FDA and  has been authorized for detection and/or diagnosis of SARS-CoV-2 by FDA under an Emergency Use Authorization (EUA). This EUA will remain  in effect (meaning this test can be used) for the duration of the COVID-19 declaration under Section 564(b)(1) of the Act, 21 U.S.C.section 360bbb-3(b)(1), unless the authorization is terminated  or revoked sooner.       Influenza A by PCR NEGATIVE NEGATIVE Final   Influenza B by PCR NEGATIVE NEGATIVE Final    Comment: (NOTE) The Xpert Xpress SARS-CoV-2/FLU/RSV plus assay is intended as an aid in the diagnosis of influenza from Nasopharyngeal swab specimens and should not be used as a sole basis for treatment. Nasal washings and aspirates are unacceptable for Xpert Xpress SARS-CoV-2/FLU/RSV testing.  Fact Sheet for Patients: BloggerCourse.com  Fact Sheet for Healthcare Providers: SeriousBroker.it  This test is not yet approved or cleared by the [place] FDA and has been authorized for detection and/or diagnosis of SARS-CoV-2 by FDA under an Emergency Use Authorization (EUA). This EUA will remain in effect (meaning this test can be used) for the duration of the COVID-19 declaration under Section 564(b)(1) of the Act, 21 U.S.C. section 360bbb-3(b)(1), unless the authorization is terminated or revoked.  Performed at [facility], [address] [place], [place] [address]   Culture, blood (routine x 2)     Status: None (Preliminary result)   Collection Time: 05/06/20 12:41 AM   Specimen: BLOOD  Result Value Ref Range Status   Specimen Description BLOOD LEFT ANTECUBITAL  Final   Special Requests   Final    BOTTLES DRAWN AEROBIC AND ANAEROBIC Blood Culture adequate volume   Culture   Final    NO GROWTH 1 DAY Performed at [facility], [address], [place], [place] [address]    Report Status PENDING  Incomplete  Urine culture     Status: Abnormal   Collection Time: 05/06/20  1:55 AM   Specimen: Urine, Random  Result Value Ref Range Status   Specimen Description   Final    URINE, RANDOM Performed at [facility], [address], [place], [place] [address]    Special Requests   Final    NONE Performed at [facility], [address], [place], [place] [address]    Culture (A)  Final    <10,000 COLONIES/mL INSIGNIFICANT GROWTH Performed at [facility], [address] [place], [place] [address]    Report Status 05/07/2020 FINAL  Final  MRSA PCR Screening     Status: None  Collection Time: 05/06/20  1:55 AM   Specimen: Urine, Clean Catch; Nasopharyngeal  Result Value Ref Range Status   MRSA by PCR NEGATIVE NEGATIVE Final    Comment:        The GeneXpert MRSA Assay (FDA approved for NASAL specimens only), is one component of a comprehensive MRSA colonization surveillance program. It is not intended to diagnose MRSA infection nor to guide or monitor treatment for MRSA infections. Performed at [facility], [address] [place], [place] [id number]          Radiology Studies: DG Chest 2 View  Result Date: 05/05/2020 CLINICAL DATA:  20 year old female with shortness of breath and chest pain. EXAM: CHEST - 2 VIEW COMPARISON:  Chest radiograph dated 12/13/2016. FINDINGS: The heart size and mediastinal contours are within normal limits. Both lungs are clear. The visualized skeletal structures are unremarkable. IMPRESSION: No active cardiopulmonary disease. Electronically Signed   By: Elgie CollardArash  Radparvar M.D.   On: 05/05/2020 23:12        Scheduled Meds: . amphetamine-dextroamphetamine  20 mg Oral Daily  . dextromethorphan-guaiFENesin  1 tablet Oral BID  . enoxaparin (LOVENOX) injection  40 mg Subcutaneous Q24H  . fluticasone furoate-vilanterol  1 puff  Inhalation Daily  . ipratropium-albuterol  3 mL Nebulization Q6H WA  . mouth rinse  15 mL Mouth Rinse q12n4p  . melatonin  5 mg Oral QHS  . methylPREDNISolone (SOLU-MEDROL) injection  40 mg Intravenous Q8H  . montelukast  10 mg Oral QHS  . sodium chloride flush  3 mL Intravenous Q12H   Continuous Infusions:   LOS: 1 day    Time spent: 30 minutes     Dorcas CarrowKuber Alexander Mcauley, MD [facility] Available via Epic secure chat 7am-7pm After these hours, please refer to coverage provider listed on [email or web address] 05/07/2020, 10:30 AM

## 2020-05-08 DIAGNOSIS — J4541 Moderate persistent asthma with (acute) exacerbation: Secondary | ICD-10-CM | POA: Diagnosis not present

## 2020-05-08 MED ORDER — PREDNISONE 10 MG PO TABS: ORAL_TABLET | ORAL | 0 refills | Status: AC

## 2020-05-08 NOTE — Discharge Summary (Addendum)
Physician Discharge Summary  Sophia Webster WJX:[id number] DOB: 11/29/2000 DOA: 05/05/2020  PCP: Oneita Hurt, No  Admit date: 05/05/2020 Discharge date: 05/08/2020  Admitted From: Home Disposition: Home  Recommendations for Outpatient Follow-up:  1. Follow up with PCP in 1-2 weeks 2. Schedule follow-up with your pulmonologist  Home Health: Not applicable Equipment/Devices: Not applicable  Discharge Condition: Stable CODE STATUS: Full code Diet recommendation: Regular diet  Discharge summary: 20 year old lady with history of moderate persistent asthma on maximal medical therapy presented to the emergency room with about 3 days of progressive shortness of breath and fever at home.  She tried some over-the-counter cold medications and utilized her home nebulizers with no much effect.  In the emergency room hemodynamically stable.  She was on moderate respiratory distress and initially treated with BiPAP and subsequently with oxygen and now on room air.  Covid and influenza negative.  Chest x-ray was normal.  Admitted and treated with high-dose IV steroids, magnesium, frequent bronchodilator therapy.  She did good clinical recovery.  Today most of the symptoms have improved and ambulated on room air. Plan: At home patient is on Brio Ellipta, albuterol, Singulair, Xolair injection every 2 weeks that she will continue. She had received IV steroids in the hospital, will prescribe a short taper for the next 4 days. Can use over-the-counter cough medications. Resume all other long-term medications. Patient does have well-established pulmonary follow-up outpatient.  Document edited only to satisfy diagnosis.  05/13/2020. Sepsis ruled out    Discharge Diagnoses:  Active Problems:   Asthma in adult, moderate persistent, with acute exacerbation    Discharge Instructions  Discharge Instructions    Call MD for:  difficulty breathing, headache or visual disturbances   Complete by: As directed    Call  MD for:  extreme fatigue   Complete by: As directed    Diet general   Complete by: As directed    Increase activity slowly   Complete by: As directed      Allergies as of 05/08/2020   No Known Allergies     Medication List    TAKE these medications   albuterol 108 (90 Base) MCG/ACT inhaler Commonly known as: VENTOLIN HFA Inhale 1-2 puffs into the lungs every 4 (four) hours as needed.   albuterol 1.25 MG/3ML nebulizer solution Commonly known as: ACCUNEB Take 1 ampule by nebulization every 4 (four) hours as needed.   amphetamine-dextroamphetamine 20 MG 24 hr capsule Commonly known as: ADDERALL XR Take 20 mg by mouth daily.   azelastine 0.1 % nasal spray Commonly known as: ASTELIN Place 2 sprays into both nostrils 2 (two) times daily.   Breo Ellipta 200-25 MCG/INH Aepb Generic drug: fluticasone furoate-vilanterol Inhale 1 puff into the lungs daily.   EPINEPHrine 0.3 mg/0.3 mL Soaj injection Commonly known as: EPI-PEN INJECT 1 PEN IN THE MUSCLE ONE TIME AS DIRECTED   fexofenadine 180 MG tablet Commonly known as: ALLEGRA Take 180 mg by mouth daily.   fluticasone 50 MCG/ACT nasal spray Commonly known as: FLONASE Place 1 spray into both nostrils daily.   Junel 1/20 1-20 MG-MCG tablet Generic drug: norethindrone-ethinyl estradiol Take 1 tablet by mouth daily.   montelukast 10 MG tablet Commonly known as: SINGULAIR Take 10 mg by mouth at bedtime.   omalizumab 150 MG injection Commonly known as: XOLAIR Inject 375 mg into the skin every 14 (fourteen) days.   predniSONE 10 MG tablet Commonly known as: DELTASONE 4 tabs daily for 1 day 3 tabs daily for 1 day 2  tabs daily for 1 day 1 tabs daily for 1 day       No Known Allergies  Consultations:  None   Procedures/Studies: DG Chest 2 View  Result Date: 05/05/2020 CLINICAL DATA:  20 year old female with shortness of breath and chest pain. EXAM: CHEST - 2 VIEW COMPARISON:  Chest radiograph dated 12/13/2016.  FINDINGS: The heart size and mediastinal contours are within normal limits. Both lungs are clear. The visualized skeletal structures are unremarkable. IMPRESSION: No active cardiopulmonary disease. Electronically Signed   By: Elgie CollardArash  Radparvar M.D.   On: 05/05/2020 23:12    (Echo, Carotid, EGD, Colonoscopy, ERCP)    Subjective: Patient seen and examined.  Mother at the bedside.  Feels much better today.  Was able to ambulate around on room air.  Still has some itchiness on the throat, however most of the symptoms improved.  Afebrile since last 48 hours.   Discharge Exam: Vitals:   05/08/20 0021 05/08/20 0542  BP: 117/72 (!) 115/57  Pulse: 95 64  Resp: 16 16  Temp: 97.8 F (36.6 C) 97.9 F (36.6 C)  SpO2: 99% 100%   Vitals:   05/07/20 1952 05/07/20 2012 05/08/20 0021 05/08/20 0542  BP:  115/65 117/72 (!) 115/57  Pulse:  (!) 58 95 64  Resp:  16 16 16   Temp:  98 F (36.7 C) 97.8 F (36.6 C) 97.9 F (36.6 C)  TempSrc:  Oral Oral Oral  SpO2: 100% 100% 99% 100%  Weight:      Height:        General: Pt is alert, awake, not in acute distress Currently on room air. Cardiovascular: RRR, S1/S2 +, no rubs, no gallops Respiratory: CTA bilaterally, no wheezing, no rhonchi, no added sounds. Abdominal: Soft, NT, ND, bowel sounds + Extremities: no edema, no cyanosis    The results of significant diagnostics from this hospitalization (including imaging, microbiology, ancillary and laboratory) are listed below for reference.     Microbiology: Recent Results (from the past 240 hour(s))  Culture, blood (routine x 2)     Status: None (Preliminary result)   Collection Time: 05/05/20 11:25 PM   Specimen: BLOOD  Result Value Ref Range Status   Specimen Description BLOOD BLOOD LEFT FOREARM  Final   Special Requests   Final    BOTTLES DRAWN AEROBIC AND ANAEROBIC Blood Culture results may not be optimal due to an excessive volume of blood received in culture bottles   Culture   Final     NO GROWTH 3 DAYS Performed at [facility], [address] [place], [place] [address]    Report Status PENDING  Incomplete  Resp Panel by RT-PCR (Flu A&B, Covid) Nasopharyngeal Swab     Status: None   Collection Time: 05/05/20 11:27 PM   Specimen: Nasopharyngeal Swab; Nasopharyngeal(NP) swabs in vial transport medium  Result Value Ref Range Status   SARS Coronavirus 2 by RT PCR NEGATIVE NEGATIVE Final    Comment: (NOTE) SARS-CoV-2 target nucleic acids are NOT DETECTED.  The SARS-CoV-2 RNA is generally detectable in upper respiratory specimens during the acute phase of infection. The lowest concentration of SARS-CoV-2 viral copies this assay can detect is 138 copies/mL. A negative result does not preclude SARS-Cov-2 infection and should not be used as the sole basis for treatment or other patient management decisions. A negative result may occur with  improper specimen collection/handling, submission of specimen other than nasopharyngeal swab, presence of viral mutation(s) within the areas targeted by this assay, and inadequate  number of viral copies(<138 copies/mL). A negative result must be combined with clinical observations, patient history, and epidemiological information. The expected result is Negative.  Fact Sheet for Patients:  BloggerCourse.com  Fact Sheet for Healthcare Providers:  SeriousBroker.it  This test is no t yet approved or cleared by the [place] FDA and  has been authorized for detection and/or diagnosis of SARS-CoV-2 by FDA under an Emergency Use Authorization (EUA). This EUA will remain  in effect (meaning this test can be used) for the duration of the COVID-19 declaration under Section 564(b)(1) of the Act, 21 U.S.C.section 360bbb-3(b)(1), unless the authorization is terminated  or revoked sooner.       Influenza A by PCR NEGATIVE NEGATIVE Final   Influenza B by PCR NEGATIVE NEGATIVE  Final    Comment: (NOTE) The Xpert Xpress SARS-CoV-2/FLU/RSV plus assay is intended as an aid in the diagnosis of influenza from Nasopharyngeal swab specimens and should not be used as a sole basis for treatment. Nasal washings and aspirates are unacceptable for Xpert Xpress SARS-CoV-2/FLU/RSV testing.  Fact Sheet for Patients: BloggerCourse.com  Fact Sheet for Healthcare Providers: SeriousBroker.it  This test is not yet approved or cleared by the [place] FDA and has been authorized for detection and/or diagnosis of SARS-CoV-2 by FDA under an Emergency Use Authorization (EUA). This EUA will remain in effect (meaning this test can be used) for the duration of the COVID-19 declaration under Section 564(b)(1) of the Act, 21 U.S.C. section 360bbb-3(b)(1), unless the authorization is terminated or revoked.  Performed at [facility] [address] [place], [place] [address]   Culture, blood (routine x 2)     Status: None (Preliminary result)   Collection Time: 05/06/20 12:41 AM   Specimen: BLOOD  Result Value Ref Range Status   Specimen Description BLOOD LEFT ANTECUBITAL  Final   Special Requests   Final    BOTTLES DRAWN AEROBIC AND ANAEROBIC Blood Culture adequate volume   Culture   Final    NO GROWTH 2 DAYS Performed at [facility], [address], [place], [place] [address]    Report Status PENDING  Incomplete  Urine culture     Status: Abnormal   Collection Time: 05/06/20  1:55 AM   Specimen: Urine, Random  Result Value Ref Range Status   Specimen Description   Final    URINE, RANDOM Performed at [facility], [address], [place], [place] [address]    Special Requests   Final    NONE Performed at [facility], [address], [place], [place] [address]    Culture (A)  Final    <10,000 COLONIES/mL INSIGNIFICANT GROWTH Performed at [facility], [address] [place], [place] [address]    Report Status 05/07/2020 FINAL  Final  MRSA PCR Screening     Status: None   Collection Time: 05/06/20  1:55 AM   Specimen: Urine, Clean Catch; Nasopharyngeal  Result Value Ref Range Status   MRSA by PCR NEGATIVE NEGATIVE Final    Comment:        The GeneXpert MRSA Assay (FDA approved for NASAL specimens only), is one component of a comprehensive MRSA colonization surveillance program. It is not intended to diagnose MRSA infection nor to guide or monitor treatment for MRSA infections. Performed at [facility], [address] [place], [place] [address]      Labs: BNP (last 3 results) No results for input(s): BNP in the last 8760 hours.  Basic Metabolic Panel: Recent Labs  Lab 05/05/20 2256 05/06/20 0308  NA 136 138  K 3.7 4.0  CL 104 108  CO2 23 21*  GLUCOSE 97 176*  BUN 7 9  CREATININE 0.83 0.92  CALCIUM 9.1 8.3*   Liver Function Tests: No results for input(s): AST, ALT, ALKPHOS, BILITOT, PROT, ALBUMIN in the last 168 hours. No results for input(s): LIPASE, AMYLASE in the last 168 hours. No results for input(s): AMMONIA in the last 168 hours. CBC: Recent Labs  Lab 05/05/20 2256 05/06/20 0308  WBC 7.7 7.0  NEUTROABS 6.0  --   HGB 13.0 12.1  HCT 38.2 36.0  MCV 88.0 88.9  PLT 276 234   Cardiac Enzymes: No results for input(s): CKTOTAL, CKMB, CKMBINDEX, TROPONINI in the last 168 hours. BNP: Invalid input(s): POCBNP CBG: Recent Labs  Lab 05/06/20 0144  GLUCAP 123*   D-Dimer No results for input(s): DDIMER in the last 72 hours. Hgb A1c No results for input(s): HGBA1C in the last 72 hours. Lipid Profile No results for input(s): CHOL, HDL, LDLCALC, TRIG, CHOLHDL, LDLDIRECT in the last 72 hours. Thyroid function studies No results for input(s): TSH, T4TOTAL, T3FREE, THYROIDAB in the last 72 hours.  Invalid input(s): FREET3 Anemia work up No results for input(s): VITAMINB12, FOLATE, FERRITIN, TIBC, IRON,  RETICCTPCT in the last 72 hours. Urinalysis    Component Value Date/Time   COLORURINE STRAW (A) 05/06/2020 0155   APPEARANCEUR CLEAR (A) 05/06/2020 0155   LABSPEC 1.005 05/06/2020 0155   PHURINE 8.0 05/06/2020 0155   GLUCOSEU NEGATIVE 05/06/2020 0155   HGBUR NEGATIVE 05/06/2020 0155   BILIRUBINUR NEGATIVE 05/06/2020 0155   KETONESUR NEGATIVE 05/06/2020 0155   PROTEINUR NEGATIVE 05/06/2020 0155   NITRITE NEGATIVE 05/06/2020 0155   LEUKOCYTESUR NEGATIVE 05/06/2020 0155   Sepsis Labs Invalid input(s): PROCALCITONIN,  WBC,  LACTICIDVEN Microbiology Recent Results (from the past 240 hour(s))  Culture, blood (routine x 2)     Status: None (Preliminary result)   Collection Time: 05/05/20 11:25 PM   Specimen: BLOOD  Result Value Ref Range Status   Specimen Description BLOOD BLOOD LEFT FOREARM  Final   Special Requests   Final    BOTTLES DRAWN AEROBIC AND ANAEROBIC Blood Culture results may not be optimal due to an excessive volume of blood received in culture bottles   Culture   Final    NO GROWTH 3 DAYS Performed at [facility], [address] [place], [place] [address]    Report Status PENDING  Incomplete  Resp Panel by RT-PCR (Flu A&B, Covid) Nasopharyngeal Swab     Status: None   Collection Time: 05/05/20 11:27 PM   Specimen: Nasopharyngeal Swab; Nasopharyngeal(NP) swabs in vial transport medium  Result Value Ref Range Status   SARS Coronavirus 2 by RT PCR NEGATIVE NEGATIVE Final    Comment: (NOTE) SARS-CoV-2 target nucleic acids are NOT DETECTED.  The SARS-CoV-2 RNA is generally detectable in upper respiratory specimens during the acute phase of infection. The lowest concentration of SARS-CoV-2 viral copies this assay can detect is 138 copies/mL. A negative result does not preclude SARS-Cov-2 infection and should not be used as the sole basis for treatment or other patient management decisions. A negative result may occur with  improper specimen  collection/handling, submission of specimen other than nasopharyngeal swab, presence of viral mutation(s) within the areas targeted by this assay, and inadequate number of viral copies(<138 copies/mL). A negative result must be combined with clinical observations, patient history, and epidemiological information.  The expected result is Negative.  Fact Sheet for Patients:  BloggerCourse.com  Fact Sheet for Healthcare Providers:  SeriousBroker.it  This test is no t yet approved or cleared by the [place] FDA and  has been authorized for detection and/or diagnosis of SARS-CoV-2 by FDA under an Emergency Use Authorization (EUA). This EUA will remain  in effect (meaning this test can be used) for the duration of the COVID-19 declaration under Section 564(b)(1) of the Act, 21 U.S.C.section 360bbb-3(b)(1), unless the authorization is terminated  or revoked sooner.       Influenza A by PCR NEGATIVE NEGATIVE Final   Influenza B by PCR NEGATIVE NEGATIVE Final    Comment: (NOTE) The Xpert Xpress SARS-CoV-2/FLU/RSV plus assay is intended as an aid in the diagnosis of influenza from Nasopharyngeal swab specimens and should not be used as a sole basis for treatment. Nasal washings and aspirates are unacceptable for Xpert Xpress SARS-CoV-2/FLU/RSV testing.  Fact Sheet for Patients: BloggerCourse.com  Fact Sheet for Healthcare Providers: SeriousBroker.it  This test is not yet approved or cleared by the [place] FDA and has been authorized for detection and/or diagnosis of SARS-CoV-2 by FDA under an Emergency Use Authorization (EUA). This EUA will remain in effect (meaning this test can be used) for the duration of the COVID-19 declaration under Section 564(b)(1) of the Act, 21 U.S.C. section 360bbb-3(b)(1), unless the authorization is terminated or revoked.  Performed at [facility], [address] [place], [place] [address]   Culture, blood (routine x 2)     Status: None (Preliminary result)   Collection Time: 05/06/20 12:41 AM   Specimen: BLOOD  Result Value Ref Range Status   Specimen Description BLOOD LEFT ANTECUBITAL  Final   Special Requests   Final    BOTTLES DRAWN AEROBIC AND ANAEROBIC Blood Culture adequate volume   Culture   Final    NO GROWTH 2 DAYS Performed at [facility], [address], [place], [place] [address]    Report Status PENDING  Incomplete  Urine culture     Status: Abnormal   Collection Time: 05/06/20  1:55 AM   Specimen: Urine, Random  Result Value Ref Range Status   Specimen Description   Final    URINE, RANDOM Performed at [facility], [address], [place], [place] [address]    Special Requests   Final    NONE Performed at [facility], [address], [place], [place] [address]    Culture (A)  Final    <10,000 COLONIES/mL INSIGNIFICANT GROWTH Performed at [facility], [address], [place], [place] [address]    Report Status 05/07/2020 FINAL  Final  MRSA PCR Screening     Status: None   Collection Time: 05/06/20  1:55 AM   Specimen: Urine, Clean Catch; Nasopharyngeal  Result Value Ref Range Status   MRSA by PCR NEGATIVE NEGATIVE Final    Comment:        The GeneXpert MRSA Assay (FDA approved for NASAL specimens only), is one component of a comprehensive MRSA colonization surveillance program. It is not intended to diagnose MRSA infection nor to guide or monitor treatment for MRSA infections. Performed at [facility], [address] [place], [place] [address]      Time coordinating discharge:  35 minutes  SIGNED:   Dorcas Carrow, MD  [facility] 05/08/2020, 7:52 AM

## 2020-05-08 NOTE — Progress Notes (Signed)
Patient is being discharged home.  Discharge papers given and explained to pt, verbalized understanding.  Med reviewed with pt. Meds sent electronically to pharmacy. Pt made aware.

## 2020-05-10 LAB — CULTURE, BLOOD (ROUTINE X 2): Culture: NO GROWTH

## 2020-05-11 LAB — CULTURE, BLOOD (ROUTINE X 2)
Culture: NO GROWTH
Special Requests: ADEQUATE

## 2021-01-06 IMAGING — CT CT ANGIO CHEST
2 of 6 series · 18 of 46 positions shown · IV contrast (APPLIED)
Comparison: None.

CLINICAL DATA: Short of breath, hyperventilation, asthma

EXAM:
CT ANGIOGRAPHY CHEST WITH CONTRAST
TECHNIQUE: Multidetector CT imaging of the chest was performed using the
standard protocol during bolus administration of intravenous
contrast. Multiplanar CT image reconstructions and MIPs were
obtained to evaluate the vascular anatomy.
CONTRAST:  75mL OMNIPAQUE IOHEXOL 350 MG/ML SOLN

[Series 5: thins · axial · 0.64mm/px · z∈[-255,-40]mm · 15 of 237 slices shown]
[im 11/237  lung]
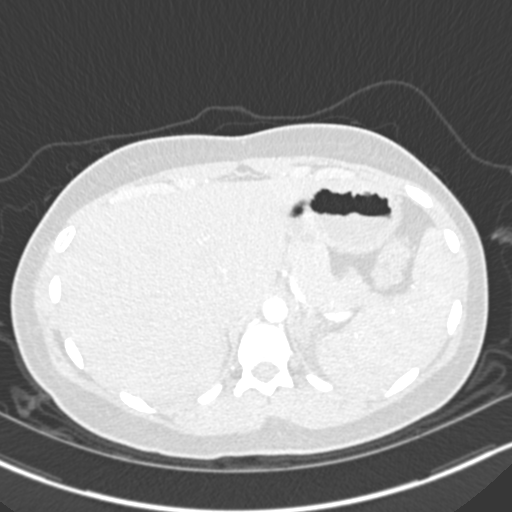
[im 31/237  soft-tissue]
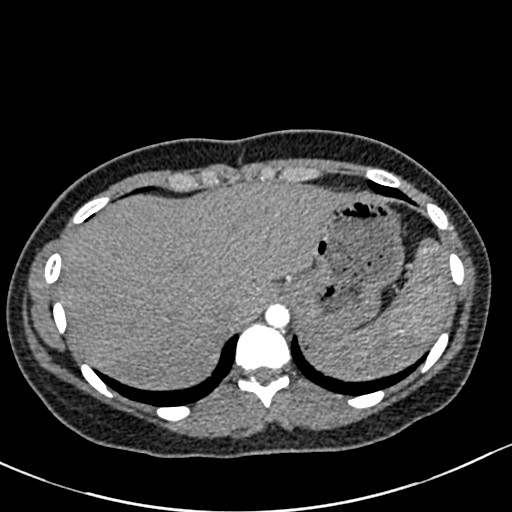
[im 42/237  lung]
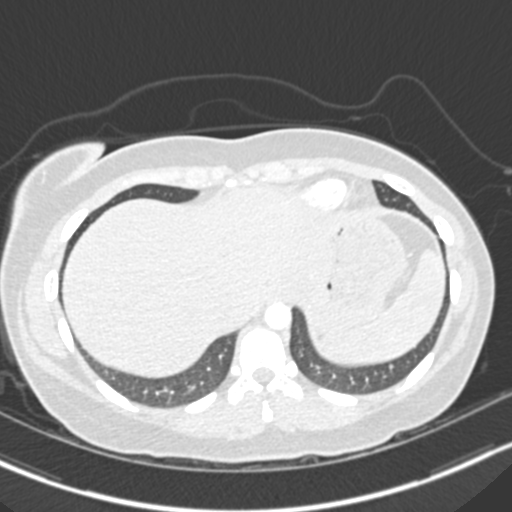
[im 62/237  soft-tissue]
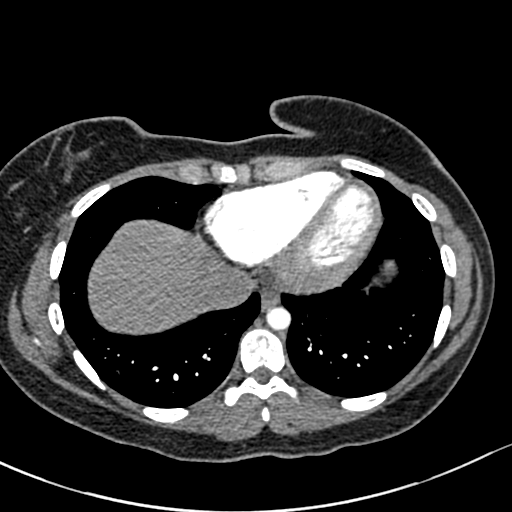
[im 72/237  lung]
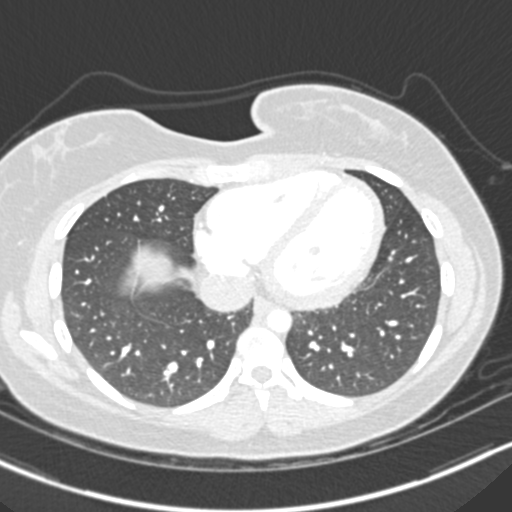
[im 93/237  soft-tissue]
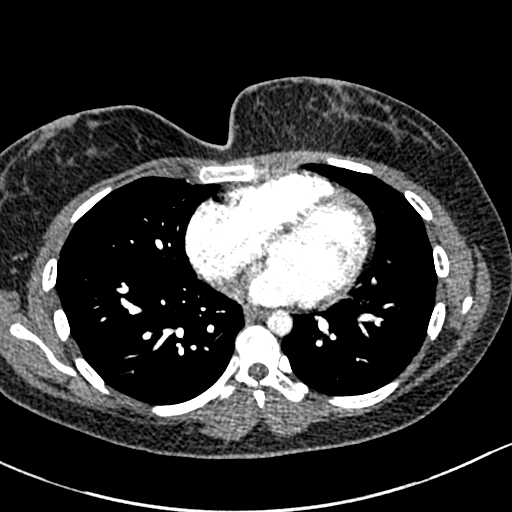
[im 103/237  lung]
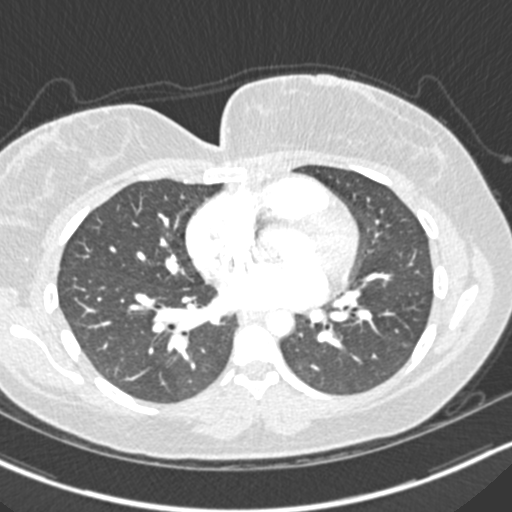
[im 124/237  soft-tissue]
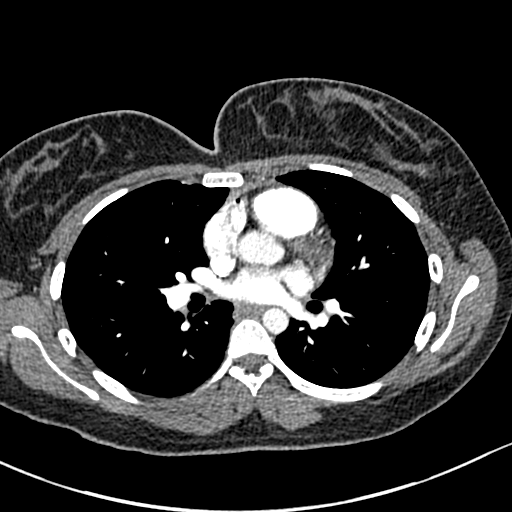
[im 134/237  lung]
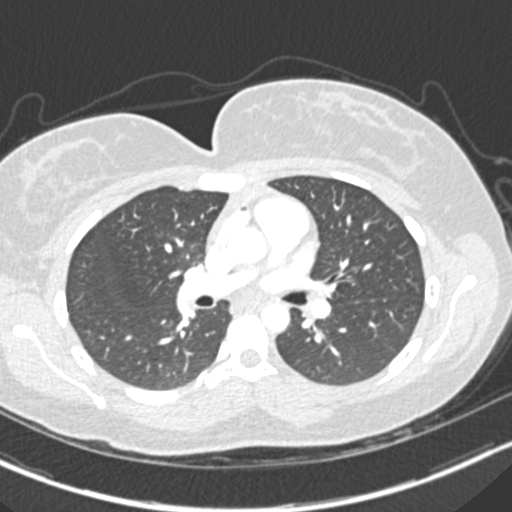
[im 144/237  soft-tissue]
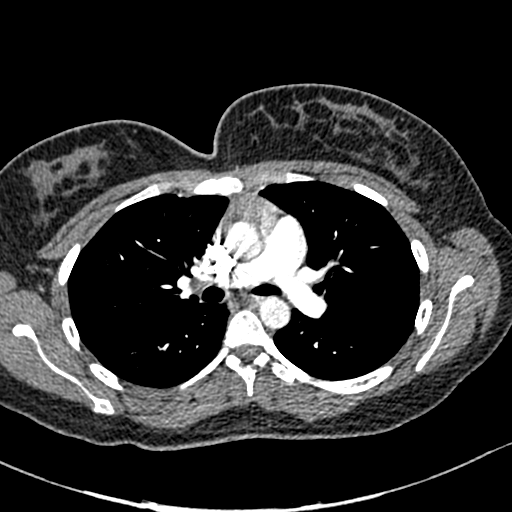
[im 165/237  lung]
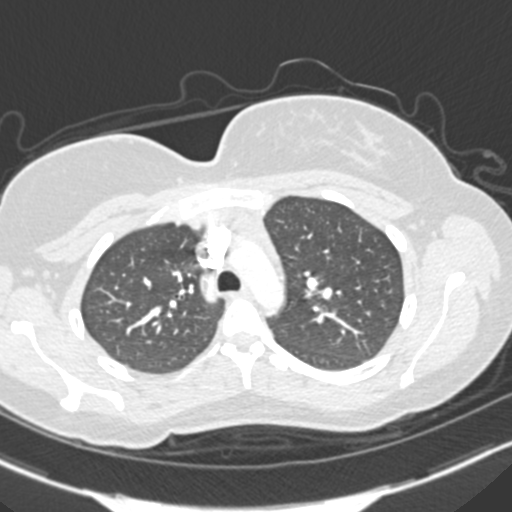
[im 175/237  soft-tissue]
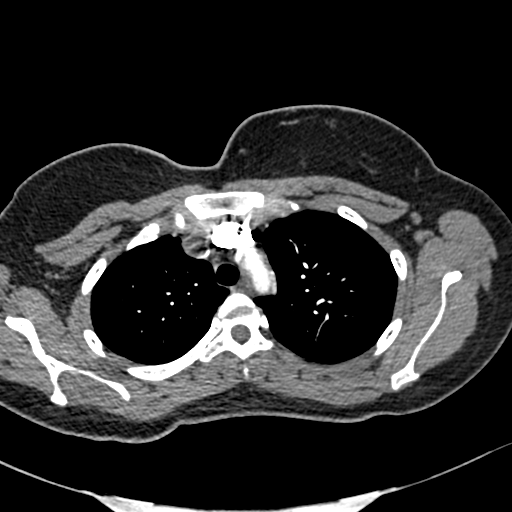
[im 195/237  lung]
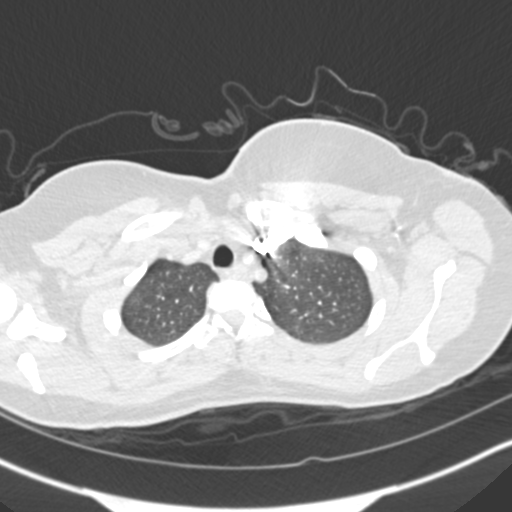
[im 206/237  soft-tissue]
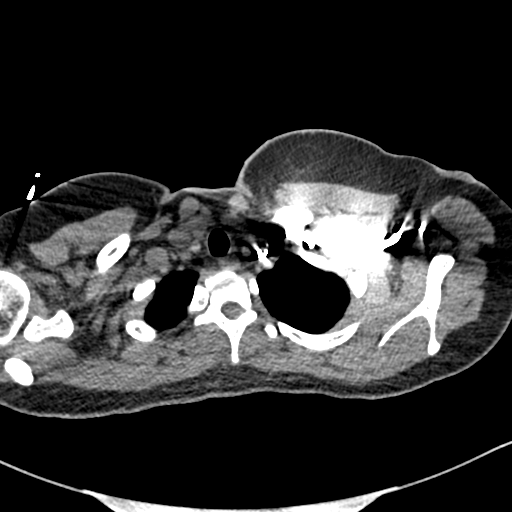
[im 226/237  lung]
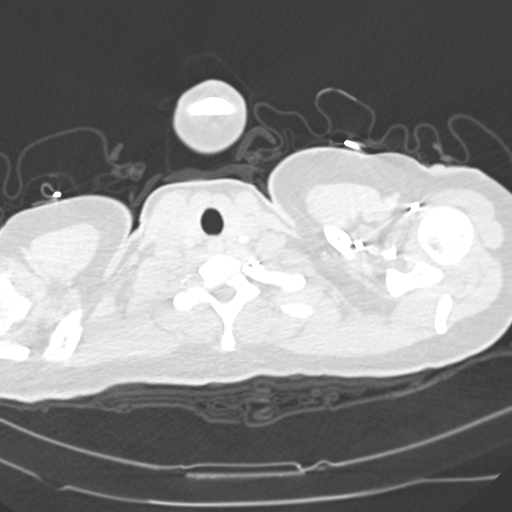

[Series 7: coronal mpr · coronal · 0.49mm/px · 3 of 68 slices shown]
[im 17/68  soft-tissue]
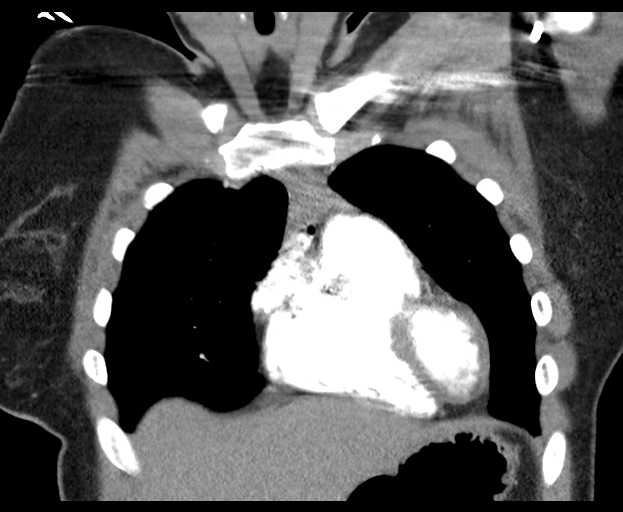
[im 34/68  soft-tissue]
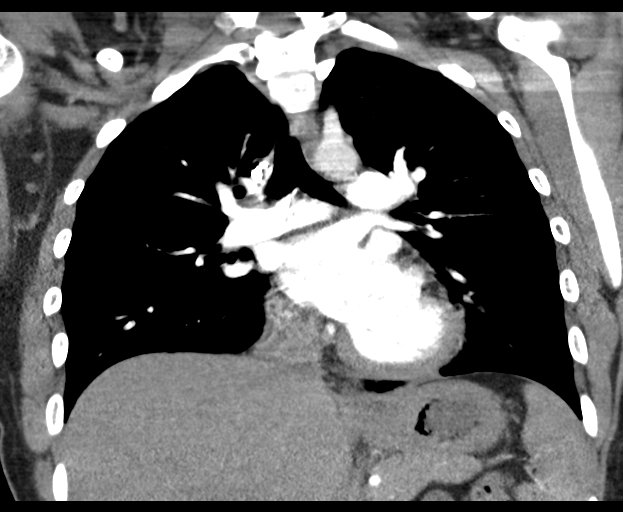
[im 51/68  soft-tissue]
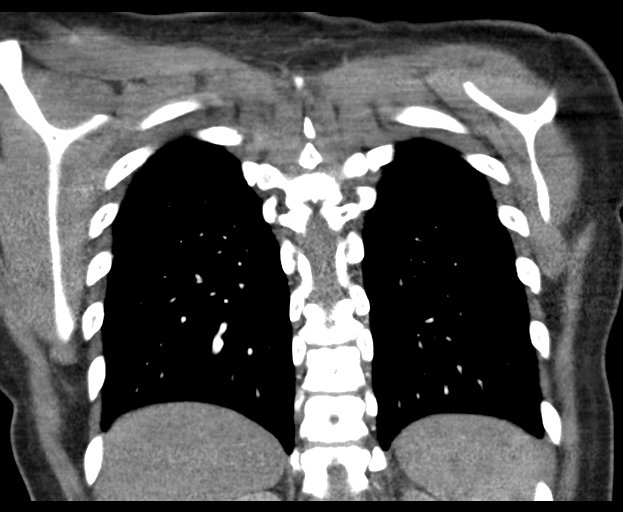

[18 of 46 positions shown; findings below may reference images not displayed]

FINDINGS: Cardiovascular: This is a technically adequate evaluation of the
pulmonary vasculature. No filling defects or pulmonary emboli. Heart
is mildly enlarged with biventricular dilatation. No pericardial
effusion. Thoracic aorta demonstrates normal caliber without
dissection.

Mediastinum/Nodes: No enlarged mediastinal, hilar, or axillary lymph
nodes. Thyroid gland, trachea, and esophagus demonstrate no
significant findings. Soft tissue density within the anterior
mediastinum likely residual thymus in a patient of this age.

Lungs/Pleura: No airspace disease, effusion, or pneumothorax.
Central airways are patent.

Upper Abdomen: No acute abnormality.

Musculoskeletal: No acute or destructive bony lesions. Reconstructed
images demonstrate no additional findings.

Review of the MIP images confirms the above findings.
IMPRESSION: 1. No evidence of pulmonary embolus.
2. No acute intrathoracic process.
3. Mild cardiomegaly with bilateral ventricular dilatation.

## 2024-01-19 ENCOUNTER — Ambulatory Visit (INDEPENDENT_AMBULATORY_CARE_PROVIDER_SITE_OTHER)

## 2024-01-19 ENCOUNTER — Ambulatory Visit (INDEPENDENT_AMBULATORY_CARE_PROVIDER_SITE_OTHER): Admitting: Gastroenterology

## 2024-01-19 ENCOUNTER — Encounter (INDEPENDENT_AMBULATORY_CARE_PROVIDER_SITE_OTHER): Payer: Self-pay

## 2024-01-19 VITALS — BP 116/79 | HR 74 | Temp 97.0°F | Resp 22 | Ht 64.0 in | Wt 145.0 lb

## 2024-01-19 DIAGNOSIS — S99922A Unspecified injury of left foot, initial encounter: Secondary | ICD-10-CM

## 2024-01-19 DIAGNOSIS — S99912A Unspecified injury of left ankle, initial encounter: Secondary | ICD-10-CM

## 2024-01-19 DIAGNOSIS — S93401A Sprain of unspecified ligament of right ankle, initial encounter: Secondary | ICD-10-CM

## 2024-01-19 MED ORDER — KETOROLAC TROMETHAMINE 30 MG/ML IJ SOLN
30.0000 mg | Freq: Once | INTRAMUSCULAR | Status: AC
  Administered 2024-01-19: 30 mg via INTRAMUSCULAR

## 2024-01-19 NOTE — Progress Notes (Signed)
 Grace [name]  OFFICE NOTE         Subjective   Historian: Patient      Chief Complaint   Patient presents with    Ankle Injury     Pt states that fell and twisted her L ankle today.         History of Present Illness  Grace [name] is a 23 year old female who presents with severe pain following a fall.    She fell 1 to 2 hours ago when she tripped over a step in the garage and landed on the floor. She has excruciating pain on the right side radiating to the foot. She recalls a possible fracture on that side many years ago. She has been icing the area. She is unable to bear weight on the right foot due to pain and has difficulty moving. She notes numbness in her toes and more intense pain on the underside of the affected area, so she has not tried to put weight on it.    History:  Medications and Allergies reviewed.   Pertinent Past Medical, Surgical, Family and Social History were reviewed.        Objective     Vitals:    01/19/24 1132   BP: 116/79   Cuff Size: Medium   Pulse: 74   Resp: 22   Temp: 97 F (36.1 C)   TempSrc: Tympanic   SpO2: 97%   Weight: 65.8 kg (145 lb)   Height: 1.626 m (5' 4)      Body mass index is 24.89 kg/m.          Physical Exam  Cardiovascular:      Rate and Rhythm: Normal rate and regular rhythm.      Pulses: Normal pulses.      Heart sounds: Normal heart sounds.   Pulmonary:      Effort: Pulmonary effort is normal.      Breath sounds: Normal breath sounds.   Musculoskeletal:         General: Swelling, tenderness and signs of injury present.      Left ankle: Swelling present. Tenderness present over the lateral malleolus. Decreased range of motion.      Left foot: Decreased range of motion. Swelling, tenderness and bony tenderness present.        Legs:    Neurological:      Mental Status: She is oriented to person, place, and time.   Skin:     Findings: No bruising or erythema.      Physical Exam    Urgent [name] Course   There were no labs reviewed with this patient  during the visit.    X-Ray  The following X-ray studies were ordered, visualized and independently interpreted by me. Results were discussed with the patient/family.     XR Ankle Left 3+ Views  Result Date: 01/19/2024  HISTORY: Left ankle and foot pain status post injury.. COMPARISON: None. FINDINGS: Left ankle: No erosions demonstrated.  No acute fracture or dislocation is seen.  Cartilage spaces are not significantly narrowed. Bone mineralization and alignment are maintained.  Soft tissues are unremarkable. Left foot:  No erosions demonstrated.  No acute fracture or dislocation is seen.  Cartilage spaces are not significantly narrowed.  Bone mineralization and alignment are maintained.  Soft tissues are unremarkable.      No acute fracture or dislocation. Derick Daunt, MD 01/19/2024 12:51 PM    XR Foot Left AP Lateral And Oblique  Result Date: 01/19/2024  HISTORY: Left ankle and foot pain status post injury.. COMPARISON: None. FINDINGS: Left ankle: No erosions demonstrated.  No acute fracture or dislocation is seen.  Cartilage spaces are not significantly narrowed. Bone mineralization and alignment are maintained.  Soft tissues are unremarkable. Left foot:  No erosions demonstrated.  No acute fracture or dislocation is seen.  Cartilage spaces are not significantly narrowed.  Bone mineralization and alignment are maintained.  Soft tissues are unremarkable.      No acute fracture or dislocation. Derick Daunt, MD 01/19/2024 12:51 PM    X-ray ordered and images reviewed. Preliminary reading: 3  view XR of left ankle reveals no fracture or dislocation. Results of x-ray discussed with the patient/family. Radiologist reading also reviewed and copy of report provided to patient.    X-ray ordered and images reviewed. Preliminary reading: 2  view XR of left foot reveals no fracture or dislocation. Results of x-ray discussed with the patient/family. Radiologist reading also reviewed and copy of report provided to patient.    Administrations This Visit       ketorolac  (TORADOL ) injection 30 mg       Admin Date  01/19/2024 Action  Given Dose  30 mg Route  Intramuscular Documented By  Bevely Sinks, MA                  Procedures   Procedures     Assessment / Plan     Differential Diagnoses including but not limited to: sprain, strain, fracture, dislocation, contusion, hematoma     Assessment & Plan  Left ankle sprain  Acute left ankle sprain with significant pain and difficulty bearing weight. No fracture on x-ray. Severity suggests more than mild sprain.  - Provided crutches for non-weight bearing support.  - Instructed to use a boot with an insert for support.  - Advised to elevate and ice the ankle at home.  - Prescribed Tylenol  1000 mg every six hours for pain management.  - Prescribed Motrin  600 mg every six hours, alternating with Tylenol .  - Encouraged gradual weight-bearing as tolerated to prevent muscle weakness.       Haillie was seen today for ankle injury.    Diagnoses and all orders for this visit:    Injury of left ankle and foot, initial encounter  -     XR Ankle Left 3+ Views; Future  -     XR Foot Left AP Lateral And Oblique; Future  -     ketorolac  (TORADOL ) injection 30 mg  -     Task for [employer]         The indications for early follow-up with PCP and return to UC were discussed. Patient/family received education on the working diagnosis, diagnostic uncertainties, and proposed treatment plan. Indications for emergency evaluation in the ED were reviewed. Written and verbal discharge instructions were provided and discussed and all questions from the patient/family were addressed, with no apparent barriers.      Verbal consent obtained to record this visit.
